# Patient Record
Sex: Female | Born: 1975 | Race: Black or African American | Hispanic: No | Marital: Married | Smoking: Never smoker
Health system: Southern US, Community
[De-identification: ages and names within clinical notes are randomized; demographics above are authoritative.]

## PROBLEM LIST (undated history)

## (undated) DIAGNOSIS — J302 Other seasonal allergic rhinitis: Secondary | ICD-10-CM

## (undated) DIAGNOSIS — D649 Anemia, unspecified: Secondary | ICD-10-CM

## (undated) DIAGNOSIS — N809 Endometriosis, unspecified: Secondary | ICD-10-CM

## (undated) DIAGNOSIS — Z9109 Other allergy status, other than to drugs and biological substances: Secondary | ICD-10-CM

## (undated) HISTORY — PX: WISDOM TOOTH EXTRACTION: SHX21

## (undated) HISTORY — PX: TUBAL LIGATION: SHX77

---

## 1998-03-04 ENCOUNTER — Other Ambulatory Visit: Admission: RE | Admit: 1998-03-04 | Discharge: 1998-03-04 | Payer: Self-pay | Admitting: *Deleted

## 1998-05-20 ENCOUNTER — Other Ambulatory Visit: Admission: RE | Admit: 1998-05-20 | Discharge: 1998-05-20 | Payer: Self-pay | Admitting: *Deleted

## 1999-09-03 ENCOUNTER — Other Ambulatory Visit: Admission: RE | Admit: 1999-09-03 | Discharge: 1999-09-03 | Payer: Self-pay | Admitting: *Deleted

## 2000-12-06 ENCOUNTER — Other Ambulatory Visit: Admission: RE | Admit: 2000-12-06 | Discharge: 2000-12-06 | Payer: Self-pay | Admitting: *Deleted

## 2001-02-25 ENCOUNTER — Emergency Department (HOSPITAL_COMMUNITY): Admission: EM | Admit: 2001-02-25 | Discharge: 2001-02-25 | Payer: Self-pay | Admitting: Emergency Medicine

## 2001-11-15 ENCOUNTER — Encounter (INDEPENDENT_AMBULATORY_CARE_PROVIDER_SITE_OTHER): Payer: Self-pay

## 2001-11-15 ENCOUNTER — Ambulatory Visit (HOSPITAL_COMMUNITY): Admission: RE | Admit: 2001-11-15 | Discharge: 2001-11-15 | Payer: Self-pay | Admitting: *Deleted

## 2002-04-17 ENCOUNTER — Other Ambulatory Visit: Admission: RE | Admit: 2002-04-17 | Discharge: 2002-04-17 | Payer: Self-pay | Admitting: *Deleted

## 2003-03-09 ENCOUNTER — Inpatient Hospital Stay (HOSPITAL_COMMUNITY): Admission: AD | Admit: 2003-03-09 | Discharge: 2003-03-12 | Payer: Self-pay | Admitting: *Deleted

## 2003-04-17 ENCOUNTER — Other Ambulatory Visit: Admission: RE | Admit: 2003-04-17 | Discharge: 2003-04-17 | Payer: Self-pay | Admitting: *Deleted

## 2003-09-19 ENCOUNTER — Emergency Department (HOSPITAL_COMMUNITY): Admission: EM | Admit: 2003-09-19 | Discharge: 2003-09-19 | Payer: Self-pay | Admitting: Emergency Medicine

## 2004-08-07 ENCOUNTER — Other Ambulatory Visit: Admission: RE | Admit: 2004-08-07 | Discharge: 2004-08-07 | Payer: Self-pay | Admitting: Obstetrics and Gynecology

## 2005-08-24 ENCOUNTER — Other Ambulatory Visit: Admission: RE | Admit: 2005-08-24 | Discharge: 2005-08-24 | Payer: Self-pay | Admitting: Obstetrics and Gynecology

## 2007-11-23 ENCOUNTER — Inpatient Hospital Stay (HOSPITAL_COMMUNITY): Admission: AD | Admit: 2007-11-23 | Discharge: 2007-11-26 | Payer: Self-pay | Admitting: Obstetrics and Gynecology

## 2007-11-23 ENCOUNTER — Encounter (INDEPENDENT_AMBULATORY_CARE_PROVIDER_SITE_OTHER): Payer: Self-pay | Admitting: Obstetrics and Gynecology

## 2009-08-12 ENCOUNTER — Emergency Department (HOSPITAL_COMMUNITY): Admission: EM | Admit: 2009-08-12 | Discharge: 2009-08-12 | Payer: Self-pay | Admitting: Family Medicine

## 2009-12-11 ENCOUNTER — Other Ambulatory Visit: Admission: RE | Admit: 2009-12-11 | Discharge: 2009-12-11 | Payer: Self-pay | Admitting: Internal Medicine

## 2010-02-03 ENCOUNTER — Emergency Department (HOSPITAL_COMMUNITY): Admission: EM | Admit: 2010-02-03 | Discharge: 2010-02-04 | Payer: Self-pay | Admitting: Emergency Medicine

## 2010-03-27 ENCOUNTER — Ambulatory Visit (HOSPITAL_COMMUNITY): Admission: RE | Admit: 2010-03-27 | Discharge: 2010-03-27 | Payer: Self-pay | Admitting: Obstetrics and Gynecology

## 2010-08-27 LAB — CBC
HCT: 36.5 % (ref 36.0–46.0)
Hemoglobin: 12 g/dL (ref 12.0–15.0)
MCHC: 32.8 g/dL (ref 30.0–36.0)
RBC: 4.08 MIL/uL (ref 3.87–5.11)
WBC: 4.2 10*3/uL (ref 4.0–10.5)

## 2010-08-27 LAB — SURGICAL PCR SCREEN: MRSA, PCR: NEGATIVE

## 2010-08-29 LAB — URINALYSIS, ROUTINE W REFLEX MICROSCOPIC
Glucose, UA: NEGATIVE mg/dL
Specific Gravity, Urine: 1.024 (ref 1.005–1.030)
pH: 6 (ref 5.0–8.0)

## 2010-08-29 LAB — BASIC METABOLIC PANEL
Chloride: 111 mEq/L (ref 96–112)
GFR calc Af Amer: >60 mL/min (ref >60)
GFR calc non Af Amer: >60 mL/min (ref >60)
Potassium: 3.3 mEq/L — ABNORMAL LOW (ref 3.5–5.1)
Sodium: 140 mEq/L (ref 135–145)

## 2010-09-04 LAB — POCT RAPID STREP A (OFFICE): Streptococcus, Group A Screen (Direct): NEGATIVE

## 2010-10-28 NOTE — Discharge Summary (Signed)
Rachel Reid, Rachel Reid                ACCOUNT NO.:  [id number]   MEDICAL RECORD NO.:  [id number]          PATIENT TYPE:  INP   LOCATION:  9102                          FACILITY:  [facility]   PHYSICIAN:  Guy Sandifer. Henderson Cloud, M.D. DATE OF BIRTH:  06/11/1976   DATE OF ADMISSION:  11/23/2007  DATE OF DISCHARGE:  11/26/2007                               DISCHARGE SUMMARY   ADMITTING DIAGNOSES:  1. Intrauterine pregnancy at term.  2. Labor.   DISCHARGE DIAGNOSES:  1. Intrauterine pregnancy at term.  2. Labor.   PROCEDURE:  On November 23, 2007 is low-transverse cesarean section.   REASON FOR ADMISSION:  This patient is a 36 year old married black  female G4, P1 who presents in labor.  She is admitted to the hospital  for delivery.   HOSPITAL COURSE:  She was admitted to the hospital and proceeds to 6-7  cm dilation.  She develops fever of 102 degrees.  Diagnosis of  cephalopelvic disproportion and amnionitis were made and the patient was  taken to cesarean section.  This productive of a viable female baby,  Apgars of 9 and 9.  Birth weight 8 pounds 7 ounces.  On the first  postoperative day, vital signs were stable.  She was treated with  intravenous Unasyn.  This was then converted to oral Augmentin.  Postoperatively, the hemoglobin is 9.2.  Pathology is pending.   CONDITION ON DISCHARGE:  Good.   DIET:  Regular as tolerated.   ACTIVITY:  No lifting.  No operation of automobiles.  No vaginal entry.  She is to call the office for problems include, but not limited to  temperature of 101 degrees, persistent nausea, vomiting, or heavy  bleeding.   MEDICATIONS:  1. Percocet 5/325 mg #40, 1-2 p.o. q.6 h. p.r.n.  2. Ibuprofen 600 mg q.6 h. p.r.n.  3. Augmentin 500 mg, #9, q.8 h.  4. Iron supplement daily.  5. Prenatal vitamin daily.   FOLLOWUP:  Follow up with [place] in the office in 2 weeks.      Guy Sandifer Henderson Cloud, M.D.  Electronically Signed     JET/MEDQ  D:  11/26/2007  T:  11/26/2007   Job:  [id number]

## 2010-10-28 NOTE — Op Note (Signed)
Rachel Reid, Rachel Reid                ACCOUNT NO.:  [id number]   MEDICAL RECORD NO.:  [id number]          PATIENT TYPE:  INP   LOCATION:  9102                          FACILITY:  [facility]   PHYSICIAN:  Duke Salvia. Marcelle Overlie, M.D.DATE OF BIRTH:  1976/03/24   DATE OF PROCEDURE:  DATE OF DISCHARGE:                               OPERATIVE REPORT   PREOPERATIVE DIAGNOSES:  Chronic obstructive pulmonary disease,  chorioamnionitis.   POSTOPERATIVE DIAGNOSIS:  Chronic obstructive pulmonary disease,  chorioamnionitis, plus OP presentation.   SURGEON:  Duke Salvia. Marcelle Overlie, M.D.   ANESTHESIA:  Epidural.   ESTIMATED BLOOD LOSS:  800 mL.   PROCEDURE AND FINDINGS:  The patient was taken to the operating room.  After an adequate level of epidural anesthesia was obtained with the  patient's legs supine, the abdomen was prepped and draped in usual  manner under sterile warm procedures.  Foley catheter was already  positioned draining clear urine.  She had already been started on Unasyn  IV due to a temperature elevation of 102 in labor.   Transverse Pfannenstiel incision was made after prepping and draping,  carried down to the fascia which was incised and extended transversely.  Rectus muscle divided in the midline.  Peritoneum entered superiorly  without incident and extended in a vertical fashion.  The vas  vesicouterine serosa was then incised and the bladder was bluntly and  sharply dissected off the lower uterine segment, bladder blade  repositioned.  Transverse incision made the lower segment extended with  blunt dissection.  The vertex was noted to be straight OP, was gently  rotated, easy delivery with fundal pressure of 8 pounds 7 ounces female,  Apgars 9 and 9.  The infant was suctioned, cord clamped, and passed to  pediatric team for further care.  Cord pH was sent.  Placenta was  extracted manually.  Sent to pathology.  Uterus exteriorized.  Cavity  wiped and cleaned with laparotomy pack.   Closure obtained with first  layer of 0 chromic in a locked fashion followed by an imbricating layer  of 0 chromic.  This was hemostatic.  Tubes and ovaries were normal.  The  bladder flap area was intact and hemostatic.  Prior to closure of  sponge, needle, and instrument counts reported as correct x2.  Peritoneum closed with a running 2-0 Vicryl suture.  Fascia closed from  laterally to midline on either side with a zero PDS suture.  Subcutaneous tissue was hemostatic.  Clips and Steri-Strips used on the  skin.  She tolerated this well and went to recovery room in good  condition.      Richard M. Marcelle Overlie, M.D.  Electronically Signed     RMH/MEDQ  D:  11/23/2007  T:  11/24/2007  Job:  [id number]

## 2010-10-31 NOTE — Op Note (Signed)
[facility]  Patient:    Rachel Reid, Rachel Reid Visit Number: [id number] MRN: [id number]          Service Type: DSU Location: [facility] Attending Physician:  Donne Hazel Dictated by:   Willey Blade, M.D. Proc. Date: 11/15/01 Admit Date:  11/15/2001                             Operative Report  PREOPERATIVE DIAGNOSES:       Incomplete abortion.  POSTOPERATIVE DIAGNOSES:      Incomplete abortion.  PROCEDURE:                    Dilatation and evacuation.  SURGEON:                      Willey Blade, M.D.  ANESTHESIA:                   MAC with local supplementation.  ESTIMATED BLOOD LOSS:         50 cc.  COMPLICATIONS:                None.  FINDINGS:                     Products of conception.  PROCEDURE:                    The patient was taken to the operating room where a MAC anesthetic was administered.  The patient was placed on the operating table in a dorsal lithotomy position.  The perineum and vagina were prepped and draped in the usual sterile fashion with Betadine and sterile drapes.  A speculum was placed and the anterior lip of the cervix was grasped with a single tooth tenaculum.  The anterior lip of the cervix was grasped with a single tooth tenaculum and a paracervical block was placed with about 12 cc of 1% Xylocaine in the 3 and 9 oclock position.  The cervix was then serially dilated until a #21 Pratt dilator fit easily into the intracervical os.  Multiple passes were then made with the suction curette using a #7 curved suction curette.  Multiple passes were made and products of conception were aspirated.  This was done by about five times and the uterus was noted to be empty.  All vaginal instruments were then removed and the patient awakened, and taken to the recovery room in good condition.  Blood type O+.  There were no perioperative complications. Dictated by:   Willey Blade, M.D. Attending Physician:  Donne Hazel DD:  11/15/01 TD:  11/16/01 Job: [id number]

## 2011-03-12 LAB — CBC
HCT: 27.2 — ABNORMAL LOW
HCT: 36.6
HCT: 40
Hemoglobin: 13.3
Hemoglobin: 9.2 — ABNORMAL LOW
MCHC: 33.8
MCV: 91.4
MCV: 92.3
RBC: 2.95 — ABNORMAL LOW
RBC: 4.01
RDW: 14.1
WBC: 10.1
WBC: 9.4

## 2011-03-12 LAB — COMPREHENSIVE METABOLIC PANEL
AST: 30
Alkaline Phosphatase: 179 — ABNORMAL HIGH
CO2: 22
Chloride: 104
Creatinine, Ser: 0.68
GFR calc Af Amer: >60
GFR calc non Af Amer: >60
Potassium: 4
Total Bilirubin: 0.8

## 2011-03-12 LAB — PROTEIN, URINE, RANDOM: Total Protein, Urine: 46

## 2011-03-12 LAB — SAMPLE TO BLOOD BANK

## 2012-01-21 ENCOUNTER — Emergency Department (HOSPITAL_COMMUNITY): Payer: 59

## 2012-01-21 ENCOUNTER — Emergency Department (HOSPITAL_COMMUNITY)
Admission: EM | Admit: 2012-01-21 | Discharge: 2012-01-21 | Disposition: A | Discharge status: Home / Self Care | Payer: 59 | Attending: Emergency Medicine | Admitting: Emergency Medicine

## 2012-01-21 ENCOUNTER — Encounter (HOSPITAL_COMMUNITY): Payer: Self-pay | Admitting: Emergency Medicine

## 2012-01-21 DIAGNOSIS — R51 Headache: Secondary | ICD-10-CM

## 2012-01-21 DIAGNOSIS — R42 Dizziness and giddiness: Secondary | ICD-10-CM | POA: Insufficient documentation

## 2012-01-21 DIAGNOSIS — R110 Nausea: Secondary | ICD-10-CM

## 2012-01-21 LAB — CBC
MCH: 28.4 pg (ref 26.0–34.0)
MCHC: 33.2 g/dL (ref 30.0–36.0)
MCV: 85.6 fL (ref 78.0–100.0)
Platelets: 223 10*3/uL (ref 150–400)
RBC: 4.3 MIL/uL (ref 3.87–5.11)
RDW: 13 % (ref 11.5–15.5)

## 2012-01-21 LAB — URINALYSIS, ROUTINE W REFLEX MICROSCOPIC
Glucose, UA: NEGATIVE mg/dL
Ketones, ur: NEGATIVE mg/dL
Leukocytes, UA: NEGATIVE
Protein, ur: NEGATIVE mg/dL
Urobilinogen, UA: 0.2 mg/dL (ref 0.0–1.0)

## 2012-01-21 LAB — BASIC METABOLIC PANEL
Calcium: 8.7 mg/dL (ref 8.4–10.5)
Creatinine, Ser: 0.63 mg/dL (ref 0.50–1.10)
GFR calc Af Amer: >90 mL/min (ref >90)
GFR calc non Af Amer: >90 mL/min (ref >90)
Sodium: 137 mEq/L (ref 135–145)

## 2012-01-21 LAB — URINE MICROSCOPIC-ADD ON

## 2012-01-21 MED ORDER — METOCLOPRAMIDE HCL 5 MG/ML IJ SOLN
10.0000 mg | Freq: Once | INTRAMUSCULAR | Status: AC
  Administered 2012-01-21: 10 mg via INTRAVENOUS
  Filled 2012-01-21: qty 2

## 2012-01-21 MED ORDER — HYDROCODONE-ACETAMINOPHEN 5-325 MG PO TABS: 1.0000 | ORAL_TABLET | ORAL | Status: AC | PRN

## 2012-01-21 MED ORDER — ONDANSETRON HCL 4 MG/2ML IJ SOLN
4.0000 mg | Freq: Once | INTRAMUSCULAR | Status: AC
  Administered 2012-01-21: 4 mg via INTRAVENOUS
  Filled 2012-01-21: qty 2

## 2012-01-21 MED ORDER — DIPHENHYDRAMINE HCL 50 MG/ML IJ SOLN
25.0000 mg | Freq: Once | INTRAMUSCULAR | Status: AC
  Administered 2012-01-21: 25 mg via INTRAVENOUS
  Filled 2012-01-21: qty 1

## 2012-01-21 MED ORDER — ONDANSETRON 8 MG PO TBDP: 8.0000 mg | ORAL_TABLET | Freq: Three times a day (TID) | ORAL | Status: AC | PRN

## 2012-01-21 MED ORDER — SODIUM CHLORIDE 0.9 % IV SOLN
Freq: Once | INTRAVENOUS | Status: AC
  Administered 2012-01-21: 06:00:00 via INTRAVENOUS

## 2012-01-21 MED ORDER — DEXAMETHASONE SODIUM PHOSPHATE 10 MG/ML IJ SOLN
10.0000 mg | Freq: Once | INTRAMUSCULAR | Status: AC
  Administered 2012-01-21: 10 mg via INTRAVENOUS
  Filled 2012-01-21: qty 1

## 2012-01-21 MED ORDER — POTASSIUM CHLORIDE CRYS ER 20 MEQ PO TBCR
40.0000 meq | EXTENDED_RELEASE_TABLET | Freq: Once | ORAL | Status: AC
  Administered 2012-01-21: 40 meq via ORAL
  Filled 2012-01-21: qty 2

## 2012-01-21 MED ORDER — MORPHINE SULFATE 4 MG/ML IJ SOLN
4.0000 mg | Freq: Once | INTRAMUSCULAR | Status: AC
  Administered 2012-01-21: 4 mg via INTRAVENOUS
  Filled 2012-01-21: qty 1

## 2012-01-21 NOTE — ED Notes (Signed)
Pt alert, arrives from home, c/o headache, nausea, dizziness, onset was this am, denies hx of, resp even unlabored, skin pwd, ambulates to triage, steady gait noted, speech clear

## 2012-01-21 NOTE — ED Provider Notes (Signed)
History     CSN: [id number]  Arrival date & time 01/21/12  0534   First MD Initiated Contact with Patient 01/21/12 [phone]      Chief Complaint  Patient presents with  . Headache    (Consider location/radiation/quality/duration/timing/severity/associated sxs/prior treatment) Patient is a 36 y.o. female presenting with headaches. The history is provided by the patient, the spouse and medical records.  Headache  Associated symptoms include nausea. Pertinent negatives include no fever, no shortness of breath and no vomiting.   Rachel Reid is a 36 y.o. female presents to the emergency department complaining of headache that  woke her from sleep approximately 1 AM.  She's associated nausea, fatigue and lightheadedness.  She states she feels like she might pass out, but has not had a syncopal episode.  She describes her headache as generalized, feels like a pressure and 5/10.  It began on the left side of her head, behind the left eye and is now located more generally and at the occiput. She denies that the room is spinning but feels off balance. She took one 200 mg Advil at the onset of the headache without any relief. She denies a history of migraine. She states this headache feels different from her normal tension headaches. She denies photophobia and vomiting.  She also denies sudden onset, changes in vision or this being the worst headache of her life.  She denies that the room is spinning and the lightheadedness does not worsen with lying or closing his eyes.  She denies fever, chills, confusion, vomiting, diarrhea, abdominal pain, weakness, numbness or tingling.  She complains of URI symptoms approximately one week ago which has completely resolved.    History reviewed. No pertinent past medical history.  History reviewed. No pertinent past surgical history.  No family history on file.  History  Substance Use Topics  . Smoking status: Never Smoker   . Smokeless tobacco: Not on file  .  Alcohol Use: No    OB History    Grav Para Term Preterm Abortions TAB SAB Ect Mult Living                  Review of Systems  Constitutional: Positive for fatigue. Negative for fever, diaphoresis, appetite change and unexpected weight change.  HENT: Negative for mouth sores, neck pain and neck stiffness.   Eyes: Negative for visual disturbance.  Respiratory: Negative for cough, chest tightness, shortness of breath and wheezing.   Cardiovascular: Negative for chest pain.  Gastrointestinal: Positive for nausea. Negative for vomiting, abdominal pain, diarrhea and constipation.  Genitourinary: Negative for dysuria, urgency, frequency and hematuria.  Musculoskeletal: Negative for back pain and gait problem.  Skin: Negative for rash.  Neurological: Positive for light-headedness and headaches. Negative for syncope.    Allergies  Sulfa drugs cross reactors  Home Medications  No current outpatient prescriptions on file.  BP 127/86  Pulse 76  Temp 98.6 F (37 C) (Oral)  Resp 20  Wt 125 lb (56.7 kg)  SpO2 100%  LMP 01/21/2012  Physical Exam  Nursing note and vitals reviewed. Constitutional: She is oriented to person, place, and time. She appears well-developed and well-nourished. No distress.  HENT:  Head: Normocephalic and atraumatic.  Right Ear: Tympanic membrane, external ear and ear canal normal.  Left Ear: Tympanic membrane, external ear and ear canal normal.  Nose: Nose normal. Right sinus exhibits no maxillary sinus tenderness and no frontal sinus tenderness. Left sinus exhibits no maxillary sinus tenderness and no  frontal sinus tenderness.  Mouth/Throat: Uvula is midline, oropharynx is clear and moist and mucous membranes are normal. She does not have dentures. Normal dentition. No oropharyngeal exudate.  Eyes: Conjunctivae are normal. No scleral icterus.  Neck: Normal range of motion. Neck supple.  Cardiovascular: Normal rate, regular rhythm and intact distal pulses.   Exam reveals no gallop and no friction rub.   No murmur heard. Pulmonary/Chest: Effort normal and breath sounds normal. No respiratory distress. She has no wheezes.  Abdominal: Soft. Bowel sounds are normal. She exhibits no mass. There is no tenderness. There is no rebound and no guarding.  Musculoskeletal: Normal range of motion. She exhibits no edema.  Neurological: She is alert and oriented to person, place, and time. She exhibits normal muscle tone. Coordination normal.       Speech is clear and goal oriented, follows commands Cranial nerves III - XII without deficit, no facial droop Normal strength in upper and lower extremities bilaterally, strong and equal grip strength Sensation normal to light and sharp touch Moves extremities without ataxia, coordination intact Normal finger to nose and rapid alternating movements Neg romberg, no pronator drift Normal gait    Skin: Skin is warm and dry. She is not diaphoretic.  Psychiatric: She has a normal mood and affect.    ED Course  Procedures (including critical care time)  Labs Reviewed  CBC - Abnormal; Notable for the following:    WBC 3.8 (*)     All other components within normal limits  BASIC METABOLIC PANEL - Abnormal; Notable for the following:    Potassium 3.1 (*)     All other components within normal limits  URINALYSIS, ROUTINE W REFLEX MICROSCOPIC - Abnormal; Notable for the following:    Hgb urine dipstick TRACE (*)     All other components within normal limits  URINE MICROSCOPIC-ADD ON - Abnormal; Notable for the following:    Squamous Epithelial / LPF FEW (*)     All other components within normal limits   No results found.   1. Headache   2. Nausea       MDM  [name] presents with a headache for 6 hours.  She is a healthy, nontoxic appearing female.  She is at low risk for a SAH, but I will obtain a head CT since her ssx are not classically migraine or vertigo.  I have given Zofran and Morphine for  symptom control.  Head CT is without intracranial abnormality, CBC and UA are unremarkable.  BMP with a low potassium, I will replace orally.  I will also give decadron, Reglan and benadryl and reassess.  She feels some better after the treatment. I believe this is most likely sinus/allergy related 2/2 a recent URI, hx of seasonal allergies and complaints of ear pressure and fullness without an acute otitis media.  This may be migrainous in nature, but we have treated for that here in the ED.  I will discharge home and have her follow up with her PCP.    1. Medications: vicoden, zofran 2. Treatment: rest, take medication as prescribed 3. Follow Up: with PCP         Dierdre Forth, PA-C 01/21/12 [phone]

## 2012-01-21 NOTE — ED Provider Notes (Signed)
None specific HA. Normal neuro exam. Ct head normal. Improvement in symptoms. Dc home with pcp follow up  Lyanne Co, MD 01/21/12 [phone]

## 2012-01-21 NOTE — ED Notes (Addendum)
Pt reports that she took Advil for her headache at around 4am today but it did not ease the pain at all. Pt presents no s/s colds or upper respiratory infection.  Denies trauma to head. Pt denies hx of migraine headache, states that this is new onset.

## 2012-01-22 NOTE — ED Provider Notes (Signed)
Medical screening examination/treatment/procedure(s) were performed by non-physician practitioner and as supervising physician I was immediately available for consultation/collaboration.  Geoffery Lyons, MD 01/22/12 [phone]

## 2013-09-25 IMAGING — CT CT HEAD W/O CM
2 series · 16 of 30 positions shown, 20 images · non-contrast
Comparison: None

CLINICAL DATA: Headache.

CT HEAD WITHOUT CONTRAST
TECHNIQUE: Contiguous axial images were obtained from the base of
the skull through the vertex without contrast.

[Series 2: head w/o · axial · non-contrast · 0.43mm/px · z∈[+1411,+1531]mm · 13 of 28 slices shown, 17 images]
[im 2/28  brain]
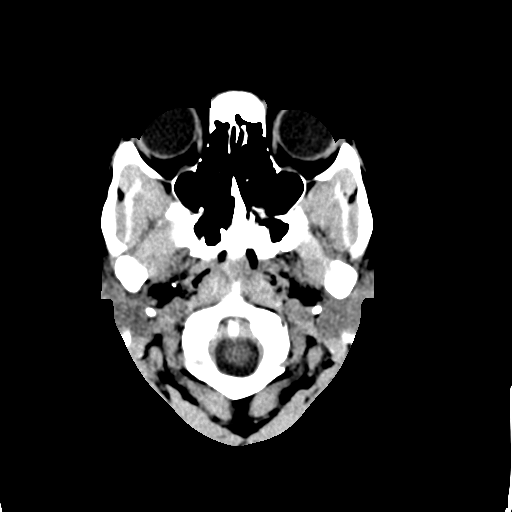
[im 2/28  bone]
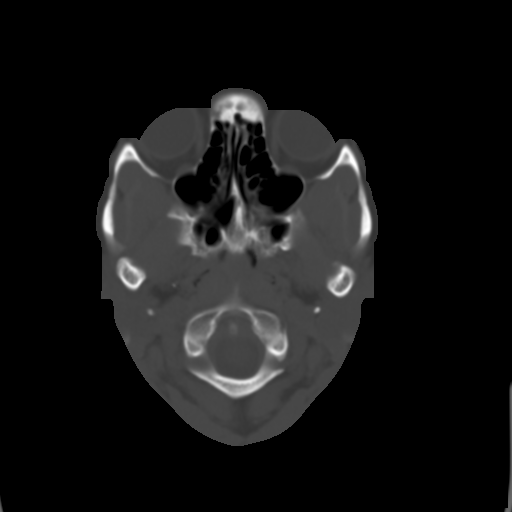
[im 4/28  brain]
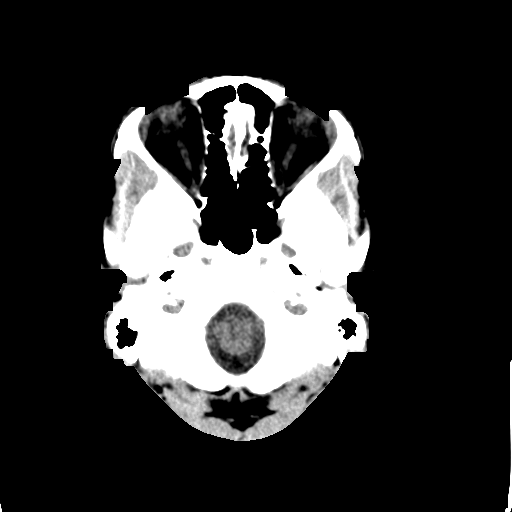
[im 6/28  brain]
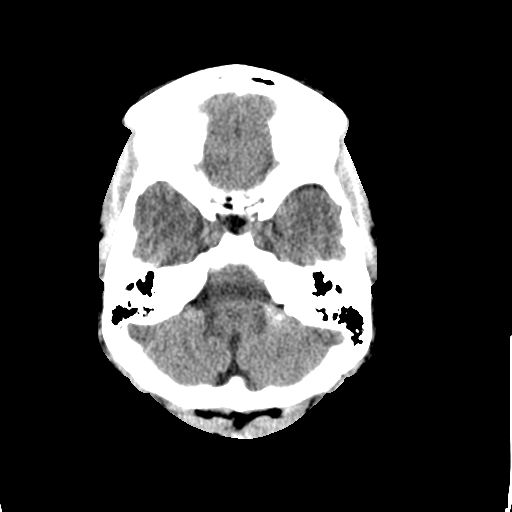
[im 8/28  brain]
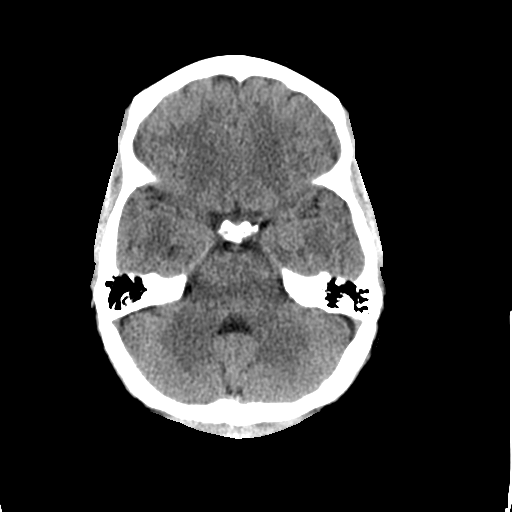
[im 10/28  brain]
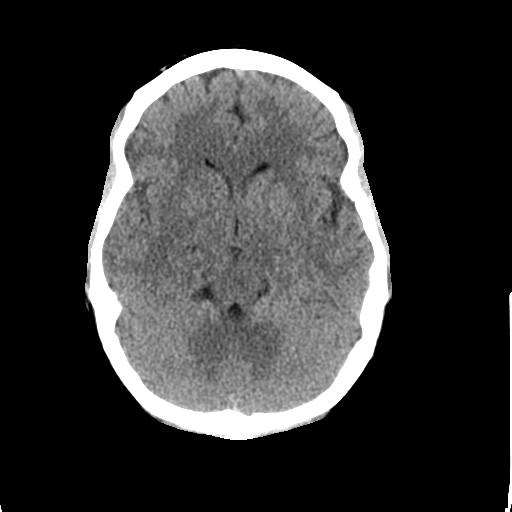
[im 10/28  bone]
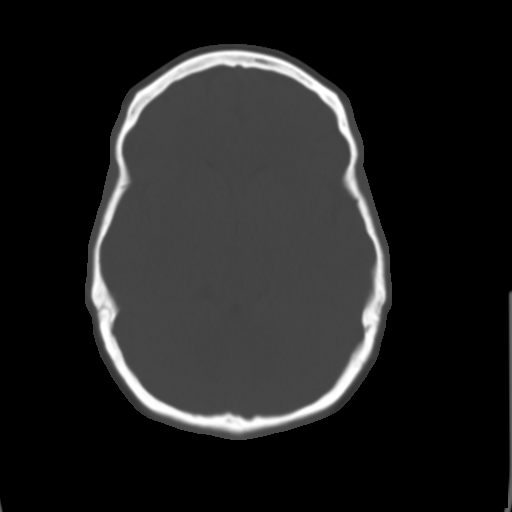
[im 12/28  brain]
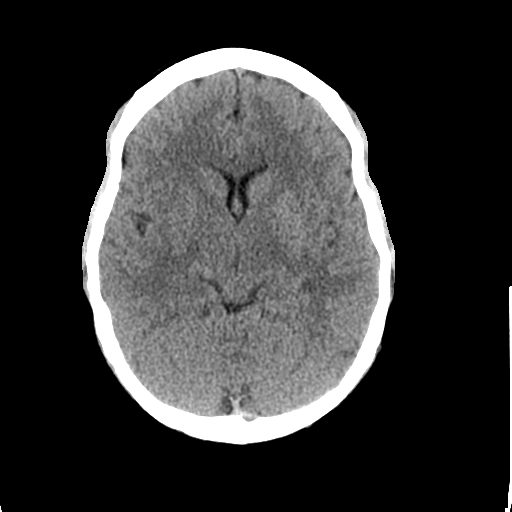
[im 14/28  brain]
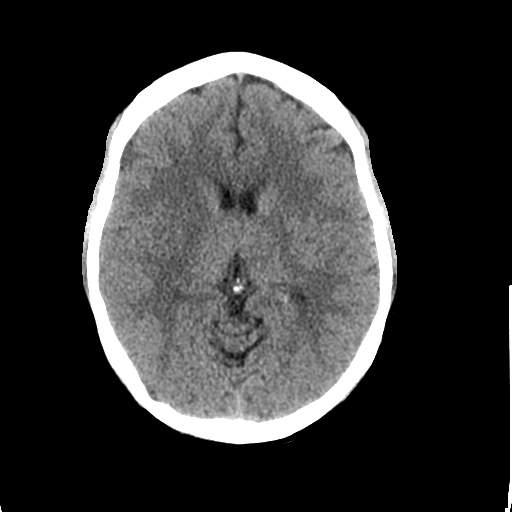
[im 16/28  brain]
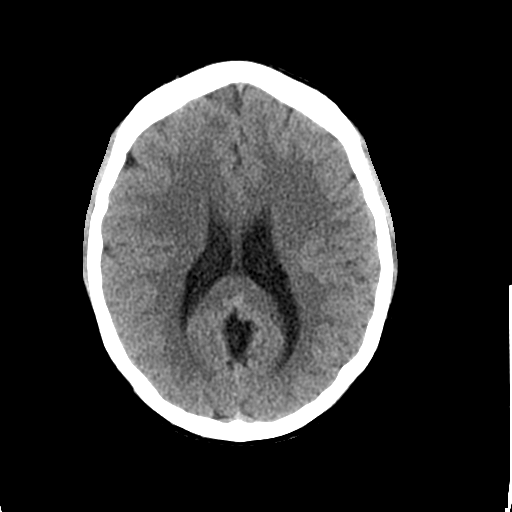
[im 18/28  brain]
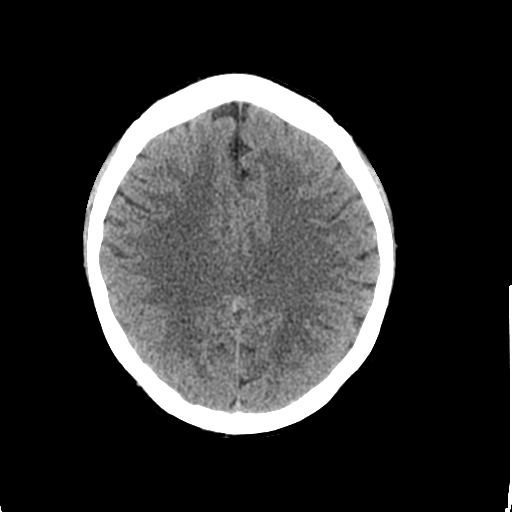
[im 18/28  bone]
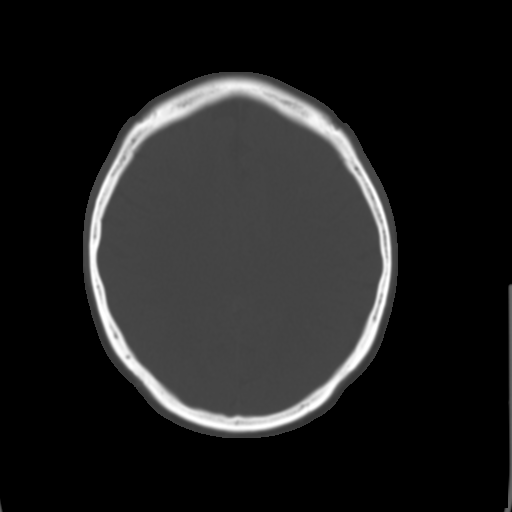
[im 20/28  brain]
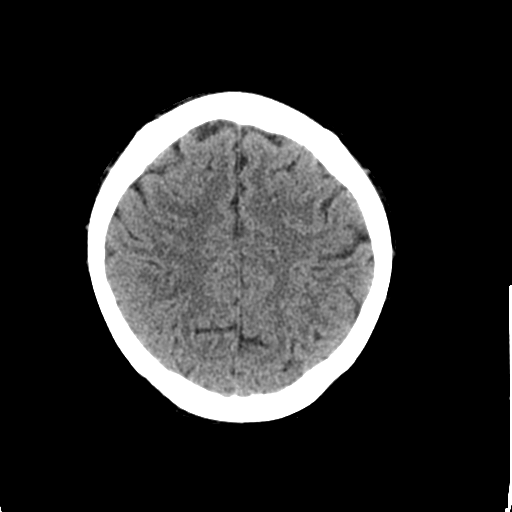
[im 22/28  brain]
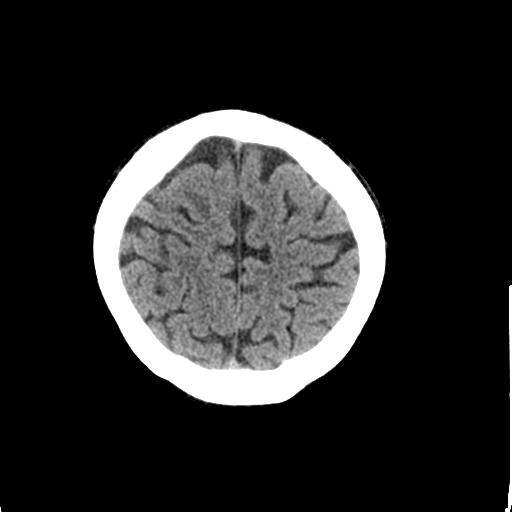
[im 24/28  brain]
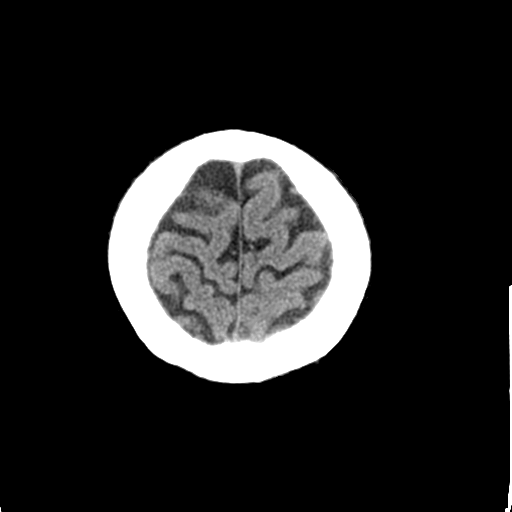
[im 26/28  brain]
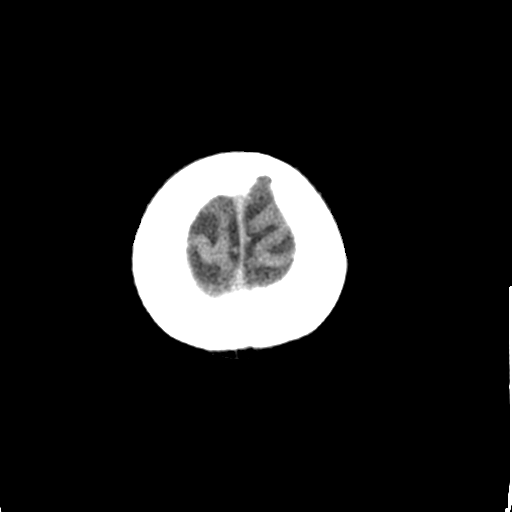
[im 26/28  bone]
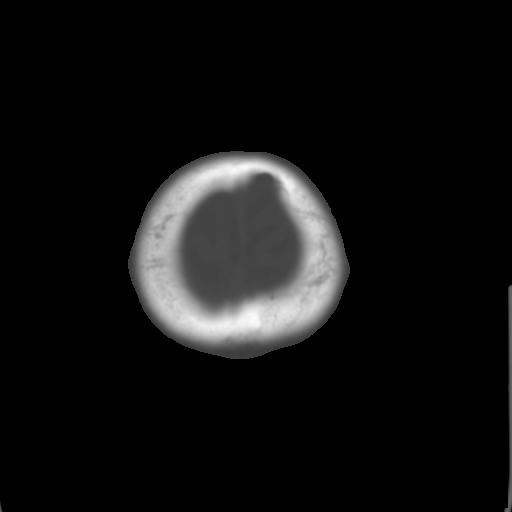

[Series 3: bone windows · axial · 0.43mm/px · z∈[+1411,+1451]mm · 3 of 28 slices shown]
[im 2/28  bone]
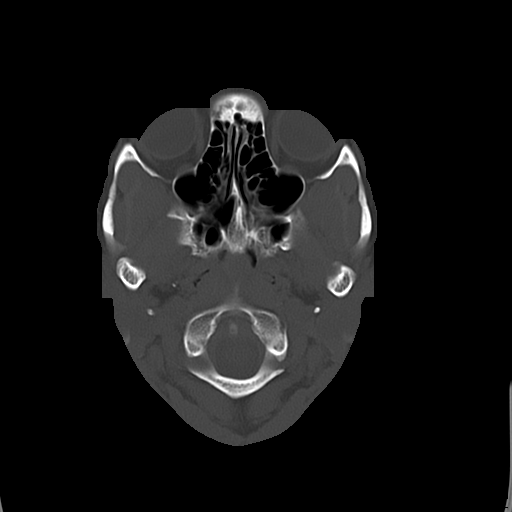
[im 6/28  bone]
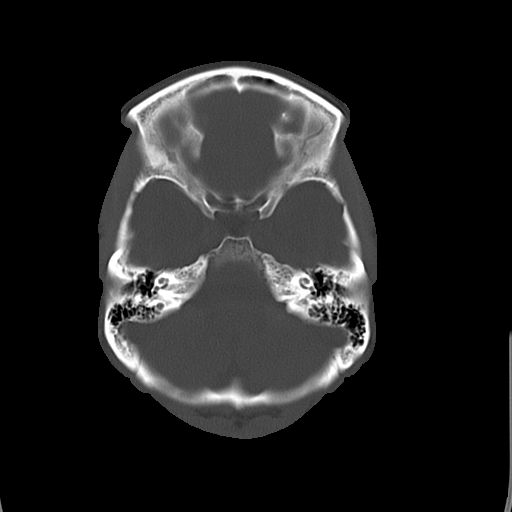
[im 10/28  bone]
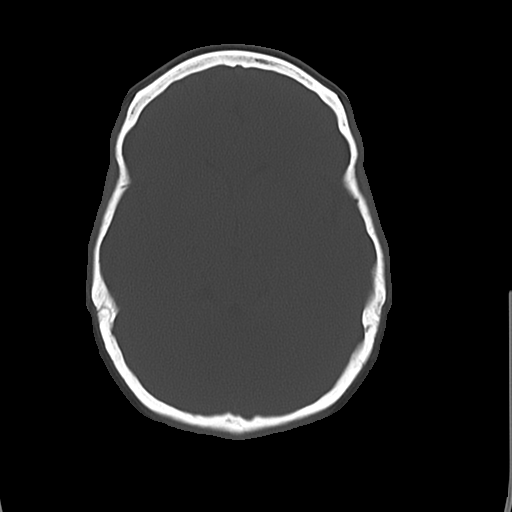

[16 of 30 positions shown; findings below may reference images not displayed]

FINDINGS: No acute intracranial abnormality.  Specifically, no
hemorrhage, hydrocephalus, mass lesion, acute infarction, or
significant intracranial injury.  No acute calvarial abnormality.
Visualized paranasal sinuses and mastoids clear.  Orbital soft
tissues unremarkable.
IMPRESSION: Normal study.

## 2014-01-02 ENCOUNTER — Encounter (HOSPITAL_COMMUNITY): Payer: Self-pay | Admitting: Pharmacy Technician

## 2014-01-04 NOTE — H&P (Signed)
  Patient name  Rachel Reid, Rachel Reid DICTATION#  [id number] CSN# [id number]  Darlyn Chamber, MD 01/04/2014 9:53 AM

## 2014-01-05 NOTE — H&P (Signed)
NAMECHANNELLE, BOTTGER NO.:  [id number]  MEDICAL RECORD NO.:  [id number]  LOCATION:                                 FACILITY:  PHYSICIAN:  Darlyn Chamber, M.D.   DATE OF BIRTH:  06/23/1975  DATE OF ADMISSION:  01/15/2014 DATE OF DISCHARGE:                             HISTORY & PHYSICAL   DATE OF SURGERY:  January 15, 2014.  HISTORY OF PRESENT ILLNESS:  The patient is a 38 year old gravida 2, para 2 female, who presents for laparoscopically assisted vaginal hysterectomy, poor management of pelvic pain secondary to pelvic endometriosis.  At the time of the patient's tubal, it was noted that she did have pelvic endometriosis.  Attempted treatment was undertaken.  She continues to have problems with dyspareunia and pain during deep penetration has become extremely limiting.  She underwent complete urological evaluation here in town, which came back negative.  In view of the continued pain and lack of response to conservative management including hormonal agents, we decided to proceed with laparoscopic- assisted vaginal hysterectomy.  Other options including repeat laparoscopy and hormonal suppression have been discussed.  ALLERGIES:  She is allergic to sulfa.  MEDICATIONS:  She is on no medications.  PAST SURGICAL HISTORY:  She had a laparoscopic tubal and two vaginal deliveries.  SOCIAL HISTORY:  Reveals no tobacco or alcohol use.  FAMILY HISTORY:  Noncontributory.  REVIEW OF SYSTEMS:  Noncontributory.  PHYSICAL EXAMINATION:  VITAL SIGNS:  The patient is afebrile.  Stable vital signs. HEENT:  The patient is normocephalic.  Pupils equal, round, reactive to light and accommodation.  Extraocular movements are intact.  Sclerae and conjunctivae are clear.  Oropharynx clear. NECK:  Without thyromegaly. BREASTS:  No discrete masses. LUNGS:  Clear. CARDIOVASCULAR:  Regular rhythm and rate without murmurs or gallops. ABDOMEN:  Benign.  No mass, organomegaly,  or tenderness. PELVIC:  Normal external genitalia.  Vaginal mucosa is clear.  Cervix unremarkable.  There is cul-de-sac tenderness.  Adnexa unremarkable. EXTREMITIES:  Trace edema. NEUROLOGIC:  Grossly within normal limits.  IMPRESSION:  Continued pelvic pain and dyspareunia secondary to endometriosis.  PLAN:  After discussion of options, the patient will undergo laparoscopic-assisted vaginal hysterectomy.  Ovaries will be left in place.  The risk of continued pain despite hysterectomy has been discussed.  The nature of the procedure has been explained.  Risks discussed including risk of infection, risk of hemorrhage that could require transfusion with the risk of AIDS or hepatitis, risk of injury to adjacent organs including bladder, bowel, ureters that could require further exploratory surgery, risk of deep venous thrombosis and pulmonary embolus.  The patient expressed understanding of potential risks, complications, and other alternatives.     Darlyn Chamber, M.D.     JSM/MEDQ  D:  01/04/2014  T:  01/04/2014  Job:  [id number]

## 2014-01-08 ENCOUNTER — Encounter (HOSPITAL_COMMUNITY): Payer: Self-pay

## 2014-01-08 ENCOUNTER — Encounter (HOSPITAL_COMMUNITY)
Admission: RE | Admit: 2014-01-08 | Discharge: 2014-01-08 | Disposition: A | Discharge status: Home / Self Care | Payer: Managed Care, Other (non HMO) | Source: Ambulatory Visit | Attending: Obstetrics and Gynecology | Admitting: Obstetrics and Gynecology

## 2014-01-08 DIAGNOSIS — Z01818 Encounter for other preprocedural examination: Secondary | ICD-10-CM | POA: Diagnosis not present

## 2014-01-08 DIAGNOSIS — Z01812 Encounter for preprocedural laboratory examination: Secondary | ICD-10-CM | POA: Insufficient documentation

## 2014-01-08 HISTORY — DX: Endometriosis, unspecified: N80.9

## 2014-01-08 HISTORY — DX: Anemia, unspecified: D64.9

## 2014-01-08 HISTORY — DX: Other allergy status, other than to drugs and biological substances: Z91.09

## 2014-01-08 HISTORY — DX: Other seasonal allergic rhinitis: J30.2

## 2014-01-08 LAB — CBC
HEMATOCRIT: 35.6 % — AB (ref 36.0–46.0)
HEMOGLOBIN: 11.6 g/dL — AB (ref 12.0–15.0)
MCH: 29.1 pg (ref 26.0–34.0)
MCHC: 32.6 g/dL (ref 30.0–36.0)
MCV: 89.2 fL (ref 78.0–100.0)
Platelets: 227 10*3/uL (ref 150–400)
RBC: 3.99 MIL/uL (ref 3.87–5.11)
RDW: 13.4 % (ref 11.5–15.5)
WBC: 4.6 10*3/uL (ref 4.0–10.5)

## 2014-01-08 NOTE — Patient Instructions (Addendum)
   Your procedure is scheduled on:  Monday, August 3  Enter through the [employer] [facility] at: 6 AM Pick up the phone at the desk and dial [phone] and inform us of your arrival.  Please call this number if you have any problems the morning of surgery: [phone]  Remember: Do not eat or drink after midnight: Sunday Take these medicines the morning of surgery with a SIP OF WATER:  allegra  Do not wear jewelry, make-up, or FINGER nail polish No metal in your hair or on your body. Do not wear lotions, powders, perfumes.  You may wear deodorant.  Do not bring valuables to the hospital. Contacts, dentures or bridgework may not be worn into surgery.  Leave suitcase in the car. After Surgery it may be brought to your room. For patients being admitted to the hospital, checkout time is 11:00am the day of discharge. Home with husband Rachel Reid cell [phone]

## 2014-01-15 ENCOUNTER — Encounter (HOSPITAL_COMMUNITY)
Admission: RE | Disposition: A | Discharge status: Home / Self Care | Payer: Self-pay | Source: Ambulatory Visit | Attending: Obstetrics and Gynecology

## 2014-01-15 ENCOUNTER — Ambulatory Visit (HOSPITAL_COMMUNITY): Payer: Managed Care, Other (non HMO) | Admitting: Anesthesiology

## 2014-01-15 ENCOUNTER — Encounter (HOSPITAL_COMMUNITY): Payer: Managed Care, Other (non HMO) | Admitting: Anesthesiology

## 2014-01-15 ENCOUNTER — Observation Stay (HOSPITAL_COMMUNITY)
Admission: RE | Admit: 2014-01-15 | Discharge: 2014-01-16 | Disposition: A | Discharge status: Home / Self Care | Payer: Managed Care, Other (non HMO) | Source: Ambulatory Visit | Attending: Obstetrics and Gynecology | Admitting: Obstetrics and Gynecology

## 2014-01-15 ENCOUNTER — Encounter (HOSPITAL_COMMUNITY): Payer: Self-pay | Admitting: Anesthesiology

## 2014-01-15 DIAGNOSIS — K219 Gastro-esophageal reflux disease without esophagitis: Secondary | ICD-10-CM | POA: Insufficient documentation

## 2014-01-15 DIAGNOSIS — N809 Endometriosis, unspecified: Secondary | ICD-10-CM | POA: Diagnosis present

## 2014-01-15 DIAGNOSIS — D649 Anemia, unspecified: Secondary | ICD-10-CM | POA: Insufficient documentation

## 2014-01-15 DIAGNOSIS — Z882 Allergy status to sulfonamides status: Secondary | ICD-10-CM | POA: Insufficient documentation

## 2014-01-15 DIAGNOSIS — N949 Unspecified condition associated with female genital organs and menstrual cycle: Principal | ICD-10-CM | POA: Insufficient documentation

## 2014-01-15 DIAGNOSIS — IMO0002 Reserved for concepts with insufficient information to code with codable children: Secondary | ICD-10-CM | POA: Insufficient documentation

## 2014-01-15 DIAGNOSIS — N803 Endometriosis of pelvic peritoneum, unspecified: Secondary | ICD-10-CM | POA: Insufficient documentation

## 2014-01-15 DIAGNOSIS — D251 Intramural leiomyoma of uterus: Secondary | ICD-10-CM | POA: Insufficient documentation

## 2014-01-15 DIAGNOSIS — N838 Other noninflammatory disorders of ovary, fallopian tube and broad ligament: Secondary | ICD-10-CM | POA: Insufficient documentation

## 2014-01-15 HISTORY — PX: BILATERAL SALPINGECTOMY: SHX5743

## 2014-01-15 HISTORY — PX: LAPAROSCOPIC ASSISTED VAGINAL HYSTERECTOMY: SHX5398

## 2014-01-15 LAB — HCG, SERUM, QUALITATIVE: PREG SERUM: NEGATIVE

## 2014-01-15 SURGERY — HYSTERECTOMY, VAGINAL, LAPAROSCOPY-ASSISTED
Anesthesia: General | Site: Abdomen

## 2014-01-15 MED ORDER — FLUMAZENIL 0.5 MG/5ML IV SOLN
INTRAVENOUS | Status: AC
  Filled 2014-01-15: qty 5

## 2014-01-15 MED ORDER — KETOROLAC TROMETHAMINE 30 MG/ML IJ SOLN
INTRAMUSCULAR | Status: AC
  Filled 2014-01-15: qty 1

## 2014-01-15 MED ORDER — HYDROMORPHONE HCL PF 1 MG/ML IJ SOLN
0.2500 mg | INTRAMUSCULAR | Status: DC | PRN
  Administered 2014-01-15 (×4): 0.5 mg via INTRAVENOUS

## 2014-01-15 MED ORDER — GLYCOPYRROLATE 0.2 MG/ML IJ SOLN
INTRAMUSCULAR | Status: DC | PRN
  Administered 2014-01-15: 0.6 mg via INTRAVENOUS

## 2014-01-15 MED ORDER — CEFAZOLIN SODIUM-DEXTROSE 2-3 GM-% IV SOLR
2.0000 g | INTRAVENOUS | Status: AC
  Administered 2014-01-15: 2 g via INTRAVENOUS

## 2014-01-15 MED ORDER — HYDROMORPHONE HCL PF 1 MG/ML IJ SOLN
INTRAMUSCULAR | Status: DC | PRN
  Administered 2014-01-15: 1 mg via INTRAVENOUS

## 2014-01-15 MED ORDER — LIDOCAINE HCL (CARDIAC) 20 MG/ML IV SOLN
INTRAVENOUS | Status: DC | PRN
  Administered 2014-01-15: 30 mg via INTRAVENOUS
  Administered 2014-01-15: 70 mg via INTRAVENOUS

## 2014-01-15 MED ORDER — ACETAMINOPHEN 325 MG PO TABS: 650.0000 mg | ORAL_TABLET | ORAL | Status: DC | PRN

## 2014-01-15 MED ORDER — ONDANSETRON HCL 4 MG/2ML IJ SOLN
INTRAMUSCULAR | Status: AC
  Filled 2014-01-15: qty 2

## 2014-01-15 MED ORDER — ROCURONIUM BROMIDE 100 MG/10ML IV SOLN
INTRAVENOUS | Status: AC
  Filled 2014-01-15: qty 1

## 2014-01-15 MED ORDER — DEXAMETHASONE SODIUM PHOSPHATE 10 MG/ML IJ SOLN
INTRAMUSCULAR | Status: DC | PRN
  Administered 2014-01-15: 4 mg via INTRAVENOUS

## 2014-01-15 MED ORDER — CEFAZOLIN SODIUM-DEXTROSE 2-3 GM-% IV SOLR
INTRAVENOUS | Status: AC
  Filled 2014-01-15: qty 50

## 2014-01-15 MED ORDER — OXYCODONE-ACETAMINOPHEN 5-325 MG PO TABS
1.0000 | ORAL_TABLET | ORAL | Status: DC | PRN
  Administered 2014-01-15: 1 via ORAL
  Administered 2014-01-15: 2 via ORAL
  Administered 2014-01-15: 1 via ORAL
  Administered 2014-01-15: 2 via ORAL
  Administered 2014-01-16 (×2): 1 via ORAL
  Filled 2014-01-15: qty 1
  Filled 2014-01-15: qty 2
  Filled 2014-01-15 (×2): qty 1
  Filled 2014-01-15: qty 2
  Filled 2014-01-15: qty 1

## 2014-01-15 MED ORDER — KETOROLAC TROMETHAMINE 30 MG/ML IJ SOLN: INTRAMUSCULAR | Status: DC | PRN

## 2014-01-15 MED ORDER — GLYCOPYRROLATE 0.2 MG/ML IJ SOLN
INTRAMUSCULAR | Status: AC
  Filled 2014-01-15: qty 3

## 2014-01-15 MED ORDER — PROMETHAZINE HCL 25 MG/ML IJ SOLN: 6.2500 mg | INTRAMUSCULAR | Status: DC | PRN

## 2014-01-15 MED ORDER — HYDROMORPHONE HCL PF 1 MG/ML IJ SOLN
INTRAMUSCULAR | Status: AC
  Filled 2014-01-15: qty 1

## 2014-01-15 MED ORDER — SCOPOLAMINE 1 MG/3DAYS TD PT72
MEDICATED_PATCH | TRANSDERMAL | Status: AC
  Administered 2014-01-15: 1.5 mg via TRANSDERMAL
  Filled 2014-01-15: qty 1

## 2014-01-15 MED ORDER — ONDANSETRON HCL 4 MG/2ML IJ SOLN: 4.0000 mg | Freq: Four times a day (QID) | INTRAMUSCULAR | Status: DC | PRN

## 2014-01-15 MED ORDER — FAMOTIDINE 20 MG PO TABS
ORAL_TABLET | ORAL | Status: AC
  Administered 2014-01-15: 20 mg via ORAL
  Filled 2014-01-15: qty 1

## 2014-01-15 MED ORDER — LACTATED RINGERS IV SOLN
INTRAVENOUS | Status: DC
  Administered 2014-01-15 – 2014-01-16 (×3): via INTRAVENOUS

## 2014-01-15 MED ORDER — NEOSTIGMINE METHYLSULFATE 10 MG/10ML IV SOLN
INTRAVENOUS | Status: AC
  Filled 2014-01-15: qty 1

## 2014-01-15 MED ORDER — MIDAZOLAM HCL 2 MG/2ML IJ SOLN
INTRAMUSCULAR | Status: AC
  Filled 2014-01-15: qty 2

## 2014-01-15 MED ORDER — FENTANYL CITRATE 0.05 MG/ML IJ SOLN
INTRAMUSCULAR | Status: DC | PRN
  Administered 2014-01-15 (×3): 50 ug via INTRAVENOUS
  Administered 2014-01-15: 100 ug via INTRAVENOUS

## 2014-01-15 MED ORDER — KETOROLAC TROMETHAMINE 30 MG/ML IJ SOLN: 15.0000 mg | Freq: Once | INTRAMUSCULAR | Status: DC | PRN

## 2014-01-15 MED ORDER — LIDOCAINE HCL (CARDIAC) 20 MG/ML IV SOLN
INTRAVENOUS | Status: AC
  Filled 2014-01-15: qty 5

## 2014-01-15 MED ORDER — MIDAZOLAM HCL 2 MG/2ML IJ SOLN
INTRAMUSCULAR | Status: DC | PRN
  Administered 2014-01-15: 2 mg via INTRAVENOUS

## 2014-01-15 MED ORDER — PROPOFOL 10 MG/ML IV EMUL
INTRAVENOUS | Status: AC
  Filled 2014-01-15: qty 20

## 2014-01-15 MED ORDER — FLUMAZENIL 0.5 MG/5ML IV SOLN
INTRAVENOUS | Status: DC | PRN
  Administered 2014-01-15: 0.2 mg via INTRAVENOUS

## 2014-01-15 MED ORDER — NEOSTIGMINE METHYLSULFATE 10 MG/10ML IV SOLN
INTRAVENOUS | Status: DC | PRN
  Administered 2014-01-15: 3 mg via INTRAVENOUS

## 2014-01-15 MED ORDER — BUPIVACAINE HCL (PF) 0.25 % IJ SOLN
INTRAMUSCULAR | Status: AC
  Filled 2014-01-15: qty 30

## 2014-01-15 MED ORDER — BUPIVACAINE HCL (PF) 0.25 % IJ SOLN
INTRAMUSCULAR | Status: DC | PRN
  Administered 2014-01-15: 10 mL

## 2014-01-15 MED ORDER — FENTANYL CITRATE 0.05 MG/ML IJ SOLN
INTRAMUSCULAR | Status: AC
  Filled 2014-01-15: qty 5

## 2014-01-15 MED ORDER — LACTATED RINGERS IV SOLN
INTRAVENOUS | Status: DC
  Administered 2014-01-15 (×3): via INTRAVENOUS

## 2014-01-15 MED ORDER — MENTHOL 3 MG MT LOZG: 1.0000 | LOZENGE | OROMUCOSAL | Status: DC | PRN

## 2014-01-15 MED ORDER — ROCURONIUM BROMIDE 100 MG/10ML IV SOLN
INTRAVENOUS | Status: DC | PRN
  Administered 2014-01-15: 40 mg via INTRAVENOUS

## 2014-01-15 MED ORDER — DEXAMETHASONE SODIUM PHOSPHATE 10 MG/ML IJ SOLN
INTRAMUSCULAR | Status: AC
  Filled 2014-01-15: qty 1

## 2014-01-15 MED ORDER — SCOPOLAMINE 1 MG/3DAYS TD PT72
1.0000 | MEDICATED_PATCH | Freq: Once | TRANSDERMAL | Status: DC
  Administered 2014-01-15: 1.5 mg via TRANSDERMAL

## 2014-01-15 MED ORDER — LACTATED RINGERS IR SOLN
Status: DC | PRN
  Administered 2014-01-15: 1

## 2014-01-15 MED ORDER — KETOROLAC TROMETHAMINE 30 MG/ML IJ SOLN
INTRAMUSCULAR | Status: DC | PRN
  Administered 2014-01-15: 30 mg via INTRAVENOUS

## 2014-01-15 MED ORDER — ONDANSETRON HCL 4 MG/2ML IJ SOLN
INTRAMUSCULAR | Status: DC | PRN
  Administered 2014-01-15: 4 mg via INTRAVENOUS

## 2014-01-15 MED ORDER — LORATADINE 10 MG PO TABS
10.0000 mg | ORAL_TABLET | Freq: Every day | ORAL | Status: DC
  Administered 2014-01-15 – 2014-01-16 (×2): 10 mg via ORAL
  Filled 2014-01-15 (×2): qty 1

## 2014-01-15 MED ORDER — MEPERIDINE HCL 25 MG/ML IJ SOLN: 6.2500 mg | INTRAMUSCULAR | Status: DC | PRN

## 2014-01-15 MED ORDER — PROPOFOL 10 MG/ML IV BOLUS
INTRAVENOUS | Status: DC | PRN
  Administered 2014-01-15: 180 mg via INTRAVENOUS

## 2014-01-15 MED ORDER — ONDANSETRON HCL 4 MG PO TABS
4.0000 mg | ORAL_TABLET | Freq: Four times a day (QID) | ORAL | Status: DC | PRN
  Administered 2014-01-15: 4 mg via ORAL
  Filled 2014-01-15: qty 1

## 2014-01-15 MED ORDER — HYDROMORPHONE HCL PF 1 MG/ML IJ SOLN
0.6000 mg | Freq: Once | INTRAMUSCULAR | Status: AC
  Administered 2014-01-15: 0.6 mg via INTRAVENOUS
  Filled 2014-01-15: qty 1

## 2014-01-15 MED ORDER — DIPHENHYDRAMINE HCL 25 MG PO CAPS
25.0000 mg | ORAL_CAPSULE | Freq: Four times a day (QID) | ORAL | Status: DC | PRN
  Administered 2014-01-15 – 2014-01-16 (×3): 25 mg via ORAL
  Filled 2014-01-15 (×3): qty 1

## 2014-01-15 MED ORDER — FAMOTIDINE 20 MG PO TABS
20.0000 mg | ORAL_TABLET | Freq: Once | ORAL | Status: AC
  Administered 2014-01-15: 20 mg via ORAL

## 2014-01-15 SURGICAL SUPPLY — 41 items
BLADE SURG 10 STRL SS (BLADE) ×4 IMPLANT
BLADE SURG 11 STRL SS (BLADE) ×8 IMPLANT
CATH ROBINSON RED A/P 16FR (CATHETERS) ×4 IMPLANT
CLOTH BEACON ORANGE TIMEOUT ST (SAFETY) ×4 IMPLANT
CONT PATH 16OZ SNAP LID 3702 (MISCELLANEOUS) ×4 IMPLANT
COVER TABLE BACK 60X90 (DRAPES) ×4 IMPLANT
DERMABOND ADVANCED (GAUZE/BANDAGES/DRESSINGS) ×2
DERMABOND ADVANCED .7 DNX12 (GAUZE/BANDAGES/DRESSINGS) ×2 IMPLANT
DRSG COVADERM PLUS 2X2 (GAUZE/BANDAGES/DRESSINGS) ×4 IMPLANT
DRSG OPSITE POSTOP 3X4 (GAUZE/BANDAGES/DRESSINGS) ×4 IMPLANT
DURAPREP 26ML APPLICATOR (WOUND CARE) ×4 IMPLANT
ELECT REM PT RETURN 9FT ADLT (ELECTROSURGICAL) ×4
ELECTRODE REM PT RTRN 9FT ADLT (ELECTROSURGICAL) ×2 IMPLANT
GLOVE BIO SURGEON STRL SZ7 (GLOVE) ×8 IMPLANT
GLOVE BIOGEL PI IND STRL 6.5 (GLOVE) ×2 IMPLANT
GLOVE BIOGEL PI IND STRL 7.0 (GLOVE) ×4 IMPLANT
GLOVE BIOGEL PI INDICATOR 6.5 (GLOVE) ×2
GLOVE BIOGEL PI INDICATOR 7.0 (GLOVE) ×4
GOWN STRL REUS W/ TWL LRG LVL3 (GOWN DISPOSABLE) ×14 IMPLANT
GOWN STRL REUS W/TWL LRG LVL3 (GOWN DISPOSABLE) ×14
NEEDLE INSUFFLATION 120MM (ENDOMECHANICALS) ×4 IMPLANT
NS IRRIG 1000ML POUR BTL (IV SOLUTION) ×4 IMPLANT
PACK LAVH (CUSTOM PROCEDURE TRAY) ×4 IMPLANT
PROTECTOR NERVE ULNAR (MISCELLANEOUS) ×4 IMPLANT
SEALER TISSUE G2 CVD JAW 45CM (ENDOMECHANICALS) ×4 IMPLANT
SET IRRIG TUBING LAPAROSCOPIC (IRRIGATION / IRRIGATOR) ×4 IMPLANT
SUT MON AB 2-0 CT1 27 (SUTURE) ×12 IMPLANT
SUT VIC AB 0 CT1 18XCR BRD8 (SUTURE) ×4 IMPLANT
SUT VIC AB 0 CT1 27 (SUTURE) ×2
SUT VIC AB 0 CT1 27XBRD ANBCTR (SUTURE) ×2 IMPLANT
SUT VIC AB 0 CT1 36 (SUTURE) ×4 IMPLANT
SUT VIC AB 0 CT1 8-18 (SUTURE) ×4
SUT VICRYL 0 UR6 27IN ABS (SUTURE) ×4 IMPLANT
SUT VICRYL 1 TIES 12X18 (SUTURE) ×4 IMPLANT
SUT VICRYL 4-0 PS2 18IN ABS (SUTURE) ×4 IMPLANT
TOWEL OR 17X24 6PK STRL BLUE (TOWEL DISPOSABLE) ×8 IMPLANT
TRAY FOLEY CATH 14FR (SET/KITS/TRAYS/PACK) ×4 IMPLANT
TROCAR BALLN 12MMX100 BLUNT (TROCAR) ×4 IMPLANT
TROCAR OPTI TIP 5M 100M (ENDOMECHANICALS) ×4 IMPLANT
WARMER LAPAROSCOPE (MISCELLANEOUS) ×4 IMPLANT
WATER STERILE IRR 1000ML POUR (IV SOLUTION) ×4 IMPLANT

## 2014-01-15 NOTE — H&P (Signed)
  History and physical exam unchanged 

## 2014-01-15 NOTE — Progress Notes (Signed)
Patient ID: Rachel Reid, female   DOB: June 13, 1976, 38 y.o.   MRN: [id number] AFF VSS AB SOFT DRESSING DRY GOOD UO

## 2014-01-15 NOTE — Transfer of Care (Signed)
Immediate Anesthesia Transfer of Care Note  Patient: Rachel Reid  Procedure(s) Performed: Procedure(s): LAPAROSCOPIC ASSISTED VAGINAL HYSTERECTOMY (N/A) BILATERAL SALPINGECTOMY (Bilateral)  Patient Location: PACU  Anesthesia Type:General  Level of Consciousness: awake, sedated and patient cooperative  Airway & Oxygen Therapy: Patient Spontanous Breathing and Patient connected to nasal cannula oxygen  Post-op Assessment: Report given to PACU RN and Post -op Vital signs reviewed and stable  Post vital signs: Reviewed and stable  Complications: No apparent anesthesia complications

## 2014-01-15 NOTE — Anesthesia Preprocedure Evaluation (Signed)
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          Cardiovascular negative cardio ROS  Rhythm:Regular     Neuro/Psych negative neurological ROS  negative psych ROS   GI/Hepatic Neg liver ROS, GERD-  Medicated and Controlled,  Endo/Other  negative endocrine ROS  Renal/GU negative Renal ROS     Musculoskeletal   Abdominal   Peds  Hematology  (+) anemia ,   Anesthesia Other Findings   Reproductive/Obstetrics                           Anesthesia Physical Anesthesia Plan  ASA: II  Anesthesia Plan: General   Post-op Pain Management:    Induction: Intravenous  Airway Management Planned: Oral ETT  Additional Equipment: None  Intra-op Plan:   Post-operative Plan: Extubation in OR  Informed Consent: I have reviewed the patients History and Physical, chart, labs and discussed the procedure including the risks, benefits and alternatives for the proposed anesthesia with the patient or authorized representative who has indicated his/her understanding and acceptance.   Dental advisory given  Plan Discussed with:   Anesthesia Plan Comments:         Anesthesia Quick Evaluation

## 2014-01-15 NOTE — Brief Op Note (Signed)
01/15/2014  9:05 AM  PATIENT:  Rachel Reid  38 y.o. female  PRE-OPERATIVE DIAGNOSIS:  Endometriosis, Dyspareunia  POST-OPERATIVE DIAGNOSIS:  Endometriosis, Dyspareunia  PROCEDURE:  Procedure(s): LAPAROSCOPIC ASSISTED VAGINAL HYSTERECTOMY (N/A) BILATERAL SALPINGECTOMY (Bilateral)  SURGEON:  Surgeon(s) and Role:    * Darlyn Chamber, MD - Primary    * Linda Hedges, DO - Assisting  PHYSICIAN ASSISTANT:   ASSISTANTS: megan morris   ANESTHESIA:   local and general  EBL:  Total I/O In: 1000 [I.V.:1000] Out: 1050 [Urine:450; Blood:600]  BLOOD ADMINISTERED:none  DRAINS: Urinary Catheter (Foley)   LOCAL MEDICATIONS USED:  MARCAINE     SPECIMEN:  Source of Specimen:  uterus and both tubes  DISPOSITION OF SPECIMEN:  PATHOLOGY  COUNTS:  YES  TOURNIQUET:  * No tourniquets in log *  DICTATION: .Other Dictation: Dictation Number [id number]  PLAN OF CARE: Admit for overnight observation  PATIENT DISPOSITION:  PACU - hemodynamically stable.   Delay start of Pharmacological VTE agent (>24hrs) due to surgical blood loss or risk of bleeding: no

## 2014-01-15 NOTE — Addendum Note (Signed)
Addendum created 01/15/14 1415 by Brock Ra, CRNA   Modules edited: Notes Section   Notes Section:  File: [id number]

## 2014-01-15 NOTE — Anesthesia Postprocedure Evaluation (Signed)
  Anesthesia Post-op Note  Patient: Rachel Reid  Procedure(s) Performed: Procedure(s): LAPAROSCOPIC ASSISTED VAGINAL HYSTERECTOMY (N/A) BILATERAL SALPINGECTOMY (Bilateral)  Patient Location: [facility]  Anesthesia Type:General  Level of Consciousness: awake, alert , oriented and patient cooperative  Airway and Oxygen Therapy: Patient Spontanous Breathing and Patient connected to nasal cannula oxygen  Post-op Pain: mild  Post-op Assessment: Post-op Vital signs reviewed, Patient's Cardiovascular Status Stable, Respiratory Function Stable, Patent Airway, No signs of Nausea or vomiting, Adequate PO intake and Pain level controlled  Post-op Vital Signs: Reviewed and stable  Last Vitals:  Filed Vitals:   01/15/14 1120  BP: 133/78  Pulse: 77  Temp:   Resp: 18    Complications: No apparent anesthesia complications

## 2014-01-15 NOTE — Op Note (Signed)
Patient name  Rachel Reid, Rachel Reid DICTATION#  [id number] CSN# [id number]   [facility], MD 01/15/2014 9:06 AM

## 2014-01-15 NOTE — Anesthesia Postprocedure Evaluation (Signed)
Anesthesia Post Note  Patient: Rachel Reid  Procedure(s) Performed: Procedure(s) (LRB): LAPAROSCOPIC ASSISTED VAGINAL HYSTERECTOMY (N/A) BILATERAL SALPINGECTOMY (Bilateral)  Anesthesia type: General  Patient location: PACU  Post pain: Pain level controlled  Post assessment: Post-op Vital signs reviewed  Last Vitals:  Filed Vitals:   01/15/14 0930  BP: 131/80  Pulse: 89  Temp:   Resp: 14    Post vital signs: Reviewed  Level of consciousness: sedated  Complications: No apparent anesthesia complications

## 2014-01-16 ENCOUNTER — Encounter (HOSPITAL_COMMUNITY): Payer: Self-pay | Admitting: Obstetrics and Gynecology

## 2014-01-16 LAB — CBC
HCT: 29.3 % — ABNORMAL LOW (ref 36.0–46.0)
Hemoglobin: 9.7 g/dL — ABNORMAL LOW (ref 12.0–15.0)
MCH: 29 pg (ref 26.0–34.0)
MCHC: 33.1 g/dL (ref 30.0–36.0)
MCV: 87.7 fL (ref 78.0–100.0)
PLATELETS: 166 10*3/uL (ref 150–400)
RBC: 3.34 MIL/uL — AB (ref 3.87–5.11)
RDW: 13.2 % (ref 11.5–15.5)
WBC: 7.3 10*3/uL (ref 4.0–10.5)

## 2014-01-16 MED ORDER — RANITIDINE HCL 150 MG PO TABS
150.0000 mg | ORAL_TABLET | Freq: Two times a day (BID) | ORAL | Status: DC
  Administered 2014-01-16: 150 mg via ORAL
  Filled 2014-01-16: qty 1

## 2014-01-16 MED ORDER — OXYCODONE-ACETAMINOPHEN 7.5-325 MG PO TABS
1.0000 | ORAL_TABLET | ORAL | Status: DC | PRN
Start: 2014-01-16 — End: 2017-03-31

## 2014-01-16 NOTE — Progress Notes (Signed)
Pt discharged to home with husband and daughters.  Condition stable.  Pt to car via wheelchair with L. Graylon Good, NT.  No equipment for home ordered at discharge.

## 2014-01-16 NOTE — Op Note (Signed)
Rachel Reid, OGLESBY NO.:  [id number]  MEDICAL RECORD NO.:  [id number]  LOCATION:  1517                          FACILITY:  [facility]  PHYSICIAN:  Darlyn Chamber, M.D.   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  OPERATIVE PROCEDURE:  Laparoscopic-assisted vaginal hysterectomy with removal of both fallopian tubes.  SURGEON:  Darlyn Chamber, M.D.  ASSISTANT:  Angie Fava.  BLOOD LOSS:  800 mL.  PACKS:  None.  DRAINS:  Urethral Foley.  INTRAOPERATIVE BLOOD PLACEMENT:  None.  COMPLICATIONS:  None.  INDICATION:  Dictated in history and physical.  PROCEDURE:  As follows.  The patient was taken to OR, placed in supine position.  After satisfactory level of general endotracheal anesthesia was obtained, the patient was placed in a dorsal lithotomy position using Allen stirrups.  Perineum and vagina were cleansed out with Betadine.  Abdomen was cleaned with DuraPrep.  After this point in time, a Hulka tenaculum was put in place and bladder was emptied by in and out catheterization.  After satisfactory time of drying, the patient was draped as sterile field.  Subumbilical incision was made with a knife. The incision was carried through subcutaneous tissue.  Fascia was identified and entered sharply.  The peritoneum was entered with blunt finger pressure.  Open laparoscopic trocar was put in place and secured. The abdomen was insufflated with carbon dioxide.  Laparoscope was introduced.  There was no evidence of injury to adjacent organs.  A 5-mm trocar was put in place under direct visualization.  Uterus was normal. She had a previous bilateral tubal ligation.  Both ovaries appeared to be normal.  I saw no active endometriosis.   Appendix was normal.  Upper abdomen including liver and tip of the gallbladder were clear.  Next, using an EnSeal, the distal end of each fallopian tube was cauterized and incised from the ovary, and was removed through the subumbilical port and sent to Pathology.  We then went to the right side, the right utero-ovarian pedicle was cauterized and incised.  The right round ligament was cauterized and incised.  We went to the left side, left utero-ovarian pedicle was cauterized and incised and the left round ligament was cauterized and incised.  At this point in time, the decision was to go vaginally.  Laparoscope was then removed.  Abdomen was deflated with carbon dioxide. Legs were repositioned.  Hulka tenaculum was then removed.  Weighted speculum was placed in vaginal vault.  The cervix was grasped with Ardis Hughs tenaculum.  Cul-de-sac was entered sharply.  Both uterosacral ligaments were clamped, cut, and suture ligated with 0-Vicryl. Reflection of vaginal mucosa anteriorly was incised and bladder dissected superiorly.  Paracervical tissue was clamped, tied with suture ligature of 0-Vicryl.  Vesicouterine space was identified and entered sharply, and retractors put in place.  Using clamp cut and tie technique with suture ligature of 0-Vicryl, the parametrium was serially separated from sized uterus.  Uterus was then clipped,  held pedicles were clamped and cut.  Uterus was passed off the operative field.  Held pedicles were secured with free ties of 0-Vicryl.  Posterior vaginal cuff was run with a running locking suture of 2-0 Monocryl.  At this point in time, the vaginal mucosa was reapproximated in the midline with interrupted sutures of 2-0 Monocryl.  Foley was placed to straight drain achieving adequate amount of clear urine.  The patient's legs were repositioned.  Laparoscope was reintroduced in the abdominal cavity.  We had the Najat suction and irrigation device. We did have some  bleeding from the left ovary and left round ligament. These were brought under control with the bipolar.  Some oozing from the vaginal cuff was also brought under control with the bipolar.  The right ovary was hemostatically intact.  We removed clots, thoroughly irrigated, deflated the abdomen.  We still had some bleeding from the left ovarian area, it was elevated and again using the bipolar, we cauterized that area.  At this point in time, we had good hemostasis. Again, deflated and reinflated, and no active bleeding was encountered. The abdomen was deflated with carbon dioxide.  All trocars were removed. Subumbilical fascia was closed with figure-of-eight of 0-Vicryl.  Skin was closed with interrupted subcuticular 4-0 Vicryl.  Suprapubic incision was closed with Dermabond.  At this point in time, the patient was taken out of dorsal lithotomy position.  Once alert, extubated, transferred to the recovery room in good condition.  Sponge, instrument, and needle count was reported as correct by circulating nurse x2.  Foley catheter remained clear at the time of closure.  The patient tolerated the procedure well and was sent to the recovery room in good condition.     Darlyn Chamber, M.D.     JSM/MEDQ  D:  01/15/2014  T:  01/15/2014  Job:  [id number]

## 2014-01-16 NOTE — Progress Notes (Signed)
1 Day Post-Op Procedure(s) (LRB): LAPAROSCOPIC ASSISTED VAGINAL HYSTERECTOMY (N/A) BILATERAL SALPINGECTOMY (Bilateral)  Subjective: Patient reports tolerating PO and no problems voiding.    Objective: I have reviewed patient's vital signs and labs.  General: alert GI: soft, non-tender; bowel sounds normal; no masses,  no organomegaly Vaginal Bleeding: minimal  Assessment: s/p Procedure(s): LAPAROSCOPIC ASSISTED VAGINAL HYSTERECTOMY (N/A) BILATERAL SALPINGECTOMY (Bilateral): stable  Plan: Discharge home  LOS: 1 day    Jalie Eiland S 01/16/2014, 9:21 AM

## 2014-01-16 NOTE — Discharge Summary (Signed)
See dictated note.

## 2014-01-17 NOTE — Discharge Summary (Signed)
[name], [name] NO.:  [id number]  MEDICAL RECORD NO.:  [id number]  LOCATION:  4580                          FACILITY:  [facility]  PHYSICIAN:  Darlyn Chamber, M.D.   DATE OF BIRTH:  [birth year]  DATE OF ADMISSION:  01/15/2014 DATE OF DISCHARGE:  01/16/2014                              DISCHARGE SUMMARY   PREOPERATIVE DIAGNOSIS:  Pelvic pain secondary to possible endometriosis.  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  PROCEDURES:  Laparoscopic-assisted vaginal hysterectomy with bilateral salpingo-oophorectomy.  For complete history and physical, please see dictated note.  COURSE IN THE HOSPITAL:  The patient underwent above-noted surgery.  She did well postoperatively.  Her postoperative hemoglobin was 9.7.  She was discharged home on her first postoperative day.  At that time, she was afebrile.  Stable vital signs.  Abdomen was soft with minimal tenderness.  All incisions were intact.  She was having minimal vaginal spotting, was voiding without difficulty, tolerating a regular diet.  In terms of complications, none were encountered during her stay in the hospital.  DISPOSITION:  The patient was discharged home in stable condition.  The patient to avoid heavy lifting, vaginal entrance, or driving a car. She was instructed to call with signs of infection, which would be fever.  Any nausea and vomiting should be reported.  Active vaginal bleeding should be reported.  Excessive pain and discomfort should be reported.  She is instructed on signs and symptoms of deep venous thrombosis and pulmonary embolus.  The office will call tomorrow and make followup arrangements in approximately 1-2 weeks.  Discharged home on Percocet needed for pain.     Darlyn Chamber, M.D.     JSM/MEDQ  D:  01/16/2014  T:  01/16/2014  Job:  [id number]

## 2014-04-24 ENCOUNTER — Other Ambulatory Visit: Payer: Self-pay | Admitting: Obstetrics and Gynecology

## 2014-04-27 LAB — CYTOLOGY - PAP

## 2015-11-30 ENCOUNTER — Ambulatory Visit (HOSPITAL_COMMUNITY)
Admission: EM | Admit: 2015-11-30 | Discharge: 2015-11-30 | Disposition: A | Discharge status: Home / Self Care | Payer: Managed Care, Other (non HMO) | Attending: Family Medicine | Admitting: Family Medicine

## 2015-11-30 ENCOUNTER — Encounter (HOSPITAL_COMMUNITY): Payer: Self-pay | Admitting: Emergency Medicine

## 2015-11-30 DIAGNOSIS — S161XXA Strain of muscle, fascia and tendon at neck level, initial encounter: Secondary | ICD-10-CM | POA: Diagnosis not present

## 2015-11-30 MED ORDER — METHOCARBAMOL 500 MG PO TABS: 500.0000 mg | ORAL_TABLET | Freq: Four times a day (QID) | ORAL | Status: DC

## 2015-11-30 MED ORDER — DICLOFENAC SODIUM 50 MG PO TBEC: 50.0000 mg | DELAYED_RELEASE_TABLET | Freq: Two times a day (BID) | ORAL | Status: DC

## 2015-11-30 NOTE — ED Provider Notes (Signed)
CSN: [id number]     Arrival date & time 11/30/15  1921 History   First MD Initiated Contact with Patient 11/30/15 2144     Chief Complaint  Patient presents with  . Back Pain   (Consider location/radiation/quality/duration/timing/severity/associated sxs/prior Treatment) Patient is a 40 y.o. female presenting with back pain. The history is provided by the patient. No language interpreter was used.  Back Pain Location:  Generalized Quality:  Aching Radiates to:  Does not radiate Pain severity:  Moderate Pain is:  Worse during the day Onset quality:  Gradual Timing:  Constant Progression:  Worsening Chronicity:  New Relieved by:  Nothing Worsened by:  Nothing tried Ineffective treatments:  None tried Associated symptoms: no weakness   Risk factors: not pregnant     Past Medical History  Diagnosis Date  . Seasonal allergies   . SVD (spontaneous vaginal delivery)     x 1  . Environmental allergies   . Endometriosis   . Anemia     hx   Past Surgical History  Procedure Laterality Date  . Tubal ligation      lap tubal  . Wisdom tooth extraction    . Cesarean section      x 1  . Laparoscopic assisted vaginal hysterectomy N/A 01/15/2014    Procedure: LAPAROSCOPIC ASSISTED VAGINAL HYSTERECTOMY;  Surgeon: Darlyn Chamber, MD;  Location: [facility];  Service: Gynecology;  Laterality: N/A;  . Bilateral salpingectomy Bilateral 01/15/2014    Procedure: BILATERAL SALPINGECTOMY;  Surgeon: Darlyn Chamber, MD;  Location: [facility];  Service: Gynecology;  Laterality: Bilateral;   No family history on file. Social History  Substance Use Topics  . Smoking status: Never Smoker   . Smokeless tobacco: Never Used  . Alcohol Use: No   OB History    No data available     Review of Systems  Musculoskeletal: Positive for back pain.  Neurological: Negative for weakness.  All other systems reviewed and are negative.   Allergies  Sulfa drugs cross reactors  Home Medications   Prior to Admission  medications   Medication Sig Start Date End Date Taking? Authorizing Provider  cholecalciferol (VITAMIN D) 1000 UNITS tablet Take 1,000 Units by mouth daily.    Historical Provider, MD  diclofenac (VOLTAREN) 50 MG EC tablet Take 1 tablet (50 mg total) by mouth 2 (two) times daily. 11/30/15   Fransico Meadow, PA-C  fexofenadine (ALLEGRA) 180 MG tablet Take 360 mg by mouth daily. Taking for allergies    Historical Provider, MD  methocarbamol (ROBAXIN) 500 MG tablet Take 1 tablet (500 mg total) by mouth 4 (four) times daily. 11/30/15   Fransico Meadow, PA-C  montelukast (SINGULAIR) 10 MG tablet Take 10 mg by mouth at bedtime.    Historical Provider, MD  Omalizumab Arvid Right [place]) Inject 1 each into the skin every 30 (thirty) days.    Historical Provider, MD  oxyCODONE-acetaminophen (PERCOCET) 7.5-325 MG per tablet Take 1 tablet by mouth every 4 (four) hours as needed for pain. 01/16/14   Arvella Nigh, MD  ranitidine (ZANTAC) 150 MG tablet Take 150 mg by mouth at bedtime.    Historical Provider, MD   Meds Ordered and Administered this Visit  Medications - No data to display  BP 129/77 mmHg  Pulse 76  Temp(Src) 98.7 F (37.1 C) (Oral)  SpO2 100% No data found.   Physical Exam  Constitutional: She is oriented to person, place, and time. She appears well-developed and well-nourished.  HENT:  Head:  Normocephalic.  Eyes: EOM are normal.  Neck: Normal range of motion.  Cardiovascular: Normal rate.   Pulmonary/Chest: Effort normal.  Abdominal: She exhibits no distension.  Musculoskeletal: Normal range of motion.  Neurological: She is alert and oriented to person, place, and time.  Skin: Skin is warm.  Psychiatric: She has a normal mood and affect.  Nursing note and vitals reviewed.   ED Course  Procedures (including critical care time)  Labs Review Labs Reviewed - No data to display  Imaging Review No results found.   Visual Acuity Review  Right Eye Distance:   Left Eye Distance:    Bilateral Distance:    Right Eye Near:   Left Eye Near:    Bilateral Near:         MDM   1. Cervical strain, acute, initial encounter    An After Visit Summary was printed and given to the patient. Meds ordered this encounter  Medications  . diclofenac (VOLTAREN) 50 MG EC tablet    Sig: Take 1 tablet (50 mg total) by mouth 2 (two) times daily.    Dispense:  14 tablet    Refill:  0    Order Specific Question:  Supervising Provider    Answer:  Billy Fischer [phone]  . methocarbamol (ROBAXIN) 500 MG tablet    Sig: Take 1 tablet (500 mg total) by mouth 4 (four) times daily.    Dispense:  20 tablet    Refill:  0    Order Specific Question:  Supervising Provider    Answer:  Ihor Gully D [place], PA-C 11/30/15 2212

## 2015-11-30 NOTE — Discharge Instructions (Signed)
Cervical Sprain  A cervical sprain is an injury in the neck in which the strong, fibrous tissues (ligaments) that connect your neck bones stretch or tear. Cervical sprains can range from mild to severe. Severe cervical sprains can cause the neck vertebrae to be unstable. This can lead to damage of the spinal cord and can result in serious nervous system problems. The amount of time it takes for a cervical sprain to get better depends on the cause and extent of the injury. Most cervical sprains heal in 1 to 3 weeks.  CAUSES   Severe cervical sprains may be caused by:    Contact sport injuries (such as from football, rugby, wrestling, hockey, auto racing, gymnastics, diving, martial arts, or boxing).    Motor vehicle collisions.    Whiplash injuries. This is an injury from a sudden forward and backward whipping movement of the head and neck.   Falls.   Mild cervical sprains may be caused by:    Being in an awkward position, such as while cradling a telephone between your ear and shoulder.    Sitting in a chair that does not offer proper support.    Working at a poorly designed computer station.    Looking up or down for long periods of time.   SYMPTOMS    Pain, soreness, stiffness, or a burning sensation in the front, back, or sides of the neck. This discomfort may develop immediately after the injury or slowly, 24 hours or more after the injury.    Pain or tenderness directly in the middle of the back of the neck.    Shoulder or upper back pain.    Limited ability to move the neck.    Headache.    Dizziness.    Weakness, numbness, or tingling in the hands or arms.    Muscle spasms.    Difficulty swallowing or chewing.    Tenderness and swelling of the neck.   DIAGNOSIS   Most of the time your health care provider can diagnose a cervical sprain by taking your history and doing a physical exam. Your health care provider will ask about previous neck injuries and any known neck  problems, such as arthritis in the neck. X-rays may be taken to find out if there are any other problems, such as with the bones of the neck. Other tests, such as a CT scan or MRI, may also be needed.   TREATMENT   Treatment depends on the severity of the cervical sprain. Mild sprains can be treated with rest, keeping the neck in place (immobilization), and pain medicines. Severe cervical sprains are immediately immobilized. Further treatment is done to help with pain, muscle spasms, and other symptoms and may include:   Medicines, such as pain relievers, numbing medicines, or muscle relaxants.    Physical therapy. This may involve stretching exercises, strengthening exercises, and posture training. Exercises and improved posture can help stabilize the neck, strengthen muscles, and help stop symptoms from returning.   HOME CARE INSTRUCTIONS    Put ice on the injured area.     Put ice in a plastic bag.     Place a towel between your skin and the bag.     Leave the ice on for 15-20 minutes, 3-4 times a day.    If your injury was severe, you may have been given a cervical collar to wear. A cervical collar is a two-piece collar designed to keep your neck from moving while it heals.      Do not remove the collar unless instructed by your health care provider.    If you have long hair, keep it outside of the collar.    Ask your health care provider before making any adjustments to your collar. Minor adjustments may be required over time to improve comfort and reduce pressure on your chin or on the back of your head.    Ifyou are allowed to remove the collar for cleaning or bathing, follow your health care provider's instructions on how to do so safely.    Keep your collar clean by wiping it with mild soap and water and drying it completely. If the collar you have been given includes removable pads, remove them every 1-2 days and hand wash them with soap and water. Allow them to air dry. They should be completely  dry before you wear them in the collar.    If you are allowed to remove the collar for cleaning and bathing, wash and dry the skin of your neck. Check your skin for irritation or sores. If you see any, tell your health care provider.    Do not drive while wearing the collar.    Only take over-the-counter or prescription medicines for pain, discomfort, or fever as directed by your health care provider.    Keep all follow-up appointments as directed by your health care provider.    Keep all physical therapy appointments as directed by your health care provider.    Make any needed adjustments to your workstation to promote good posture.    Avoid positions and activities that make your symptoms worse.    Warm up and stretch before being active to help prevent problems.   SEEK MEDICAL CARE IF:    Your pain is not controlled with medicine.    You are unable to decrease your pain medicine over time as planned.    Your activity level is not improving as expected.   SEEK IMMEDIATE MEDICAL CARE IF:    You develop any bleeding.   You develop stomach upset.   You have signs of an allergic reaction to your medicine.    Your symptoms get worse.    You develop new, unexplained symptoms.    You have numbness, tingling, weakness, or paralysis in any part of your body.   MAKE SURE YOU:    Understand these instructions.   Will watch your condition.   Will get help right away if you are not doing well or get worse.     This information is not intended to replace advice given to you by your health care provider. Make sure you discuss any questions you have with your health care provider.     Document Released: 03/29/2007 Document Revised: 06/06/2013 Document Reviewed: 12/07/2012  [employer]vier Interactive Patient Education 2016 [employer]

## 2015-11-30 NOTE — ED Notes (Signed)
Patient has neck, arm and back pain.  Patient has been taking advil, no helping.

## 2017-03-31 ENCOUNTER — Encounter (HOSPITAL_COMMUNITY): Payer: Self-pay | Admitting: Emergency Medicine

## 2017-03-31 ENCOUNTER — Emergency Department (HOSPITAL_COMMUNITY)
Admission: EM | Admit: 2017-03-31 | Discharge: 2017-03-31 | Disposition: A | Discharge status: Home / Self Care | Payer: Managed Care, Other (non HMO) | Attending: Emergency Medicine | Admitting: Emergency Medicine

## 2017-03-31 DIAGNOSIS — Z79899 Other long term (current) drug therapy: Secondary | ICD-10-CM | POA: Insufficient documentation

## 2017-03-31 DIAGNOSIS — R531 Weakness: Secondary | ICD-10-CM

## 2017-03-31 LAB — BASIC METABOLIC PANEL
ANION GAP: 9 (ref 5–15)
BUN: 13 mg/dL (ref 6–20)
CHLORIDE: 105 mmol/L (ref 101–111)
CO2: 22 mmol/L (ref 22–32)
CREATININE: 0.74 mg/dL (ref 0.44–1.00)
Calcium: 9 mg/dL (ref 8.9–10.3)
Glucose, Bld: 102 mg/dL — ABNORMAL HIGH (ref 65–99)
POTASSIUM: 3.7 mmol/L (ref 3.5–5.1)
Sodium: 136 mmol/L (ref 135–145)

## 2017-03-31 LAB — URINALYSIS, ROUTINE W REFLEX MICROSCOPIC
Bilirubin Urine: NEGATIVE
Glucose, UA: NEGATIVE mg/dL
HGB URINE DIPSTICK: NEGATIVE
Ketones, ur: NEGATIVE mg/dL
LEUKOCYTES UA: NEGATIVE
NITRITE: NEGATIVE
PROTEIN: NEGATIVE mg/dL
Specific Gravity, Urine: 1.01 (ref 1.005–1.030)
pH: 7 (ref 5.0–8.0)

## 2017-03-31 LAB — CBC
HCT: 37.6 % (ref 36.0–46.0)
Hemoglobin: 12.7 g/dL (ref 12.0–15.0)
MCH: 29.5 pg (ref 26.0–34.0)
MCHC: 33.8 g/dL (ref 30.0–36.0)
MCV: 87.2 fL (ref 78.0–100.0)
PLATELETS: 201 10*3/uL (ref 150–400)
RBC: 4.31 MIL/uL (ref 3.87–5.11)
RDW: 12.9 % (ref 11.5–15.5)
WBC: 4.5 10*3/uL (ref 4.0–10.5)

## 2017-03-31 MED ORDER — SODIUM CHLORIDE 0.9 % IV BOLUS (SEPSIS)
1000.0000 mL | Freq: Once | INTRAVENOUS | Status: AC
  Administered 2017-03-31: 1000 mL via INTRAVENOUS

## 2017-03-31 NOTE — ED Triage Notes (Signed)
Patient complaining of lightheaded, weak, and a headache. Patient states this started on Friday. Patient states she feels nauseas but no vomiting or diarrhea.

## 2017-03-31 NOTE — Discharge Instructions (Signed)
Please read instructions below.  You may have a viral illness.  You can take tylenol as needed for headache or body aches.  Drink plenty of water.  Follow up with your primary care provider if symptoms persist.  Return to the ER for new or concerning symptoms.

## 2017-03-31 NOTE — ED Provider Notes (Signed)
[facility] DEPT Provider Note   CSN: [id number] Arrival date & time: 03/31/17  2423     History   Chief Complaint Chief Complaint  Patient presents with  . Weakness  . Headache    HPI Rachel Reid is a 41 y.o. female w PMHx anemia, seasonal allergies, endometriosis, presenting to the ED with 5 days of intermittent generalized weakness and lightheadedness since Friday. She states on Friday she began feeling weak all of a sudden and slightly off balance. She states since Friday she has been having intermittent feelings of a mild dull posterior headache, nausea and generalized fatigue. She states she thinks she may be developing vertigo and wanted to get checked out. Also states she works at a daycare. She denies URI sx, vision changes, dizziness, chest pain, SOB, cough, abdominal pain, melena, urinary sx, vaginal bleeding or discharge, rash, recent insect or tick bite, or any other complaints. In ED, denies current HA or nausea.  The history is provided by the patient.    Past Medical History:  Diagnosis Date  . Anemia    hx  . Endometriosis   . Environmental allergies   . Seasonal allergies   . SVD (spontaneous vaginal delivery)    x 1    Patient Active Problem List   Diagnosis Date Noted  . Endometriosis 01/15/2014    Past Surgical History:  Procedure Laterality Date  . BILATERAL SALPINGECTOMY Bilateral 01/15/2014   Procedure: BILATERAL SALPINGECTOMY;  Surgeon: Darlyn Chamber, MD;  Location: [facility];  Service: Gynecology;  Laterality: Bilateral;  . CESAREAN SECTION     x 1  . LAPAROSCOPIC ASSISTED VAGINAL HYSTERECTOMY N/A 01/15/2014   Procedure: LAPAROSCOPIC ASSISTED VAGINAL HYSTERECTOMY;  Surgeon: Darlyn Chamber, MD;  Location: [facility];  Service: Gynecology;  Laterality: N/A;  . TUBAL LIGATION     lap tubal  . WISDOM TOOTH EXTRACTION      OB History    No data available       Home Medications    Prior to Admission medications     Medication Sig Start Date End Date Taking? Authorizing Provider  cholecalciferol (VITAMIN D) 1000 UNITS tablet Take 1,000 Units by mouth daily.   Yes [provider]  Multiple Vitamin (MULTIVITAMIN WITH MINERALS) TABS tablet Take 1 tablet by mouth daily.   Yes [provider]  Omalizumab Arvid Right ) Inject 1 each into the skin every 30 (thirty) days.   Yes [provider]    Family History History reviewed. No pertinent family history.  Social History Social History  Substance Use Topics  . Smoking status: Never Smoker  . Smokeless tobacco: Never Used  . Alcohol use No     Allergies   Sulfa drugs cross reactors   Review of Systems Review of Systems  Constitutional: Negative for chills and fever.  HENT: Negative for congestion, ear pain, rhinorrhea and sore throat.   Eyes: Negative for photophobia and visual disturbance.  Respiratory: Negative for shortness of breath.   Cardiovascular: Negative for chest pain and palpitations.  Gastrointestinal: Positive for nausea (intermittent). Negative for abdominal pain, blood in stool and vomiting.  Genitourinary: Negative for dysuria, flank pain, frequency, vaginal bleeding and vaginal discharge.  Skin: Negative for rash.  Allergic/Immunologic: Negative for immunocompromised state.  Neurological: Positive for weakness (generalized), light-headedness and headaches. Negative for dizziness and syncope.  Psychiatric/Behavioral: Negative for confusion.     Physical Exam Updated Vital Signs BP 125/83 (BP Location: Right Arm)   Pulse  80   Temp 98.2 F (36.8 C) (Oral)   Resp 16   LMP 01/05/2014   SpO2 99%   Physical Exam  Constitutional: She appears well-developed and well-nourished. No distress.  Well-appearing  HENT:  Head: Normocephalic and atraumatic.  Right Ear: Hearing, tympanic membrane, external ear and ear canal normal.  Left Ear: Hearing, tympanic membrane, external ear and ear canal normal.   Nose: Nose normal.  Mouth/Throat: Uvula is midline. No trismus in the jaw. No uvula swelling. Posterior oropharyngeal erythema (mild) present. No posterior oropharyngeal edema. No tonsillar exudate.  Eyes: Conjunctivae are normal.  Neck: Normal range of motion. Neck supple.  Cardiovascular: Normal rate, regular rhythm, normal heart sounds and intact distal pulses.  Exam reveals no friction rub.   No murmur heard. Pulmonary/Chest: Effort normal and breath sounds normal. No respiratory distress. She has no wheezes. She has no rales.  Abdominal: Soft. Bowel sounds are normal. She exhibits no distension and no mass. There is no tenderness. There is no rebound and no guarding. No hernia.  Musculoskeletal: Normal range of motion.  Lymphadenopathy:    She has no cervical adenopathy.  Neurological: She is alert.  Mental Status:  Alert, oriented, thought content appropriate, able to give a coherent history. Speech fluent without evidence of aphasia. Able to follow 2 step commands without difficulty.  Cranial Nerves:  II:  Peripheral visual fields grossly normal, pupils equal, round, reactive to light III,IV, VI: ptosis not present, extra-ocular motions intact bilaterally V,VII: smile symmetric, facial light touch sensation equal VIII: hearing grossly normal to voice  X: uvula elevates symmetrically  XI: bilateral shoulder shrug symmetric and strong XII: midline tongue extension without fassiculations Motor:  Normal tone. 5/5 in upper and lower extremities bilaterally including strong and equal grip strength and dorsiflexion/plantar flexion Sensory: Pinprick and light touch normal in all extremities.  Deep Tendon Reflexes: 2+ and symmetric in the biceps and patella Cerebellar: normal finger-to-nose with bilateral upper extremities Gait: normal gait and balance CV: distal pulses palpable throughout    Skin: Skin is warm. No rash noted.  Psychiatric: She has a normal mood and affect. Her  behavior is normal.  Nursing note and vitals reviewed.    ED Treatments / Results  Labs (all labs ordered are listed, but only abnormal results are displayed) Labs Reviewed  BASIC METABOLIC PANEL - Abnormal; Notable for the following:       Result Value   Glucose, Bld 102 (*)    All other components within normal limits  URINALYSIS, ROUTINE W REFLEX MICROSCOPIC - Abnormal; Notable for the following:    Color, Urine STRAW (*)    All other components within normal limits  CBC    EKG  EKG Interpretation  Date/Time:  Wednesday March 31 2017 04:36:32 EDT Ventricular Rate:  72 PR Interval:    QRS Duration: 96 QT Interval:  379 QTC Calculation: 415 R Axis:   63 Text Interpretation:  Sinus rhythm RSR' in V1 or V2, right VCD or RVH Confirmed by Randal Buba, April ([id number]) on 03/31/2017 4:39:36 AM Also confirmed by Randal Buba, April ([id number]), editor Laurena Spies [phone])  on 03/31/2017 7:13:53 AM       Radiology No results found.  Procedures Procedures (including critical care time)  Medications Ordered in ED Medications  sodium chloride 0.9 % bolus 1,000 mL (0 mLs Intravenous Stopped 03/31/17 0756)     Initial Impression / Assessment and Plan / ED Course  I have reviewed the triage vital signs and the nursing  notes.  Pertinent labs & imaging results that were available during my care of the patient were reviewed by me and considered in my medical decision making (see chart for details).     Pt presenting with intermittent symptoms of generalized weakness, dull mild headache, nausea. In ED, pt denies HA or nausea. No rash, no recent tick bites, no chest pain, palpitations or SOB. Normal ENT exam, normal cardiac and lung exam, normal neurologic exam. No nuchal rigidity. Pt is afebrile, non-toxic. CBC normal. BMP normal. U/A neg.  Fluids given w some improvement. Pt is well-appearing, not in distress, safe for disharge w PCP follow up.  Patient discussed with Dr.  Randal Buba.  Discussed results, findings, treatment and follow up. Patient advised of return precautions. Patient verbalized understanding and agreed with plan.   Final Clinical Impressions(s) / ED Diagnoses   Final diagnoses:  Generalized weakness    New Prescriptions Discharge Medication List as of 03/31/2017  7:39 AM       Russo, [place] N, PA-C 03/31/17 [id number]    Palumbo, April, MD 03/31/17 2348

## 2018-04-01 DIAGNOSIS — Z23 Encounter for immunization: Secondary | ICD-10-CM | POA: Diagnosis not present

## 2018-06-09 DIAGNOSIS — J4520 Mild intermittent asthma, uncomplicated: Secondary | ICD-10-CM | POA: Diagnosis not present

## 2018-06-09 DIAGNOSIS — J3081 Allergic rhinitis due to animal (cat) (dog) hair and dander: Secondary | ICD-10-CM | POA: Diagnosis not present

## 2018-06-09 DIAGNOSIS — J3089 Other allergic rhinitis: Secondary | ICD-10-CM | POA: Diagnosis not present

## 2018-06-09 DIAGNOSIS — J301 Allergic rhinitis due to pollen: Secondary | ICD-10-CM | POA: Diagnosis not present

## 2018-07-07 ENCOUNTER — Encounter: Payer: Self-pay | Admitting: Nurse Practitioner

## 2018-07-07 ENCOUNTER — Ambulatory Visit: Payer: 59 | Admitting: Nurse Practitioner

## 2018-07-07 VITALS — BP 120/78 | HR 72 | Temp 98.1°F | Ht 61.6 in | Wt 140.2 lb

## 2018-07-07 DIAGNOSIS — I10 Essential (primary) hypertension: Secondary | ICD-10-CM

## 2018-07-07 DIAGNOSIS — R42 Dizziness and giddiness: Secondary | ICD-10-CM | POA: Diagnosis not present

## 2018-07-07 DIAGNOSIS — Z87898 Personal history of other specified conditions: Secondary | ICD-10-CM

## 2018-07-07 DIAGNOSIS — Z09 Encounter for follow-up examination after completed treatment for conditions other than malignant neoplasm: Secondary | ICD-10-CM | POA: Diagnosis not present

## 2018-07-07 DIAGNOSIS — E559 Vitamin D deficiency, unspecified: Secondary | ICD-10-CM | POA: Diagnosis not present

## 2018-07-07 NOTE — Progress Notes (Signed)
  Subjective:     Patient ID: Rachel Reid , female    DOB: Aug 07, 1975 , 43 y.o.   MRN: [id number]   Chief Complaint  Patient presents with  . Hypertension    HPI  Hypertension  This is a recurrent problem. The current episode started more than 1 month ago. The problem has been resolved since onset. The problem is controlled. Pertinent negatives include no chest pain, headaches or palpitations.  Dizziness  This is a new problem. The current episode started more than 1 month ago. The problem occurs intermittently. The problem has been rapidly improving. Pertinent negatives include no chest pain, chills, congestion, coughing, fatigue or headaches. Nothing aggravates the symptoms. She has tried nothing for the symptoms.     Past Medical History:  Diagnosis Date  . Anemia    hx  . Endometriosis   . Environmental allergies   . Seasonal allergies   . SVD (spontaneous vaginal delivery)    x 1     History reviewed. No pertinent family history.   Current Outpatient Medications:  .  cholecalciferol (VITAMIN D) 1000 UNITS tablet, Take 1,000 Units by mouth daily., Disp: , Rfl:  .  Multiple Vitamin (MULTIVITAMIN WITH MINERALS) TABS tablet, Take 1 tablet by mouth daily., Disp: , Rfl:    Allergies  Allergen Reactions  . Sulfa Drugs Cross Reactors Hives     Review of Systems  Constitutional: Negative for chills and fatigue.  HENT: Negative for congestion.   Respiratory: Negative for cough.   Cardiovascular: Negative for chest pain, palpitations and leg swelling.  Neurological: Positive for dizziness. Negative for headaches.     Today's Vitals   07/07/18 1550  BP: 120/78  Pulse: 72  Temp: 98.1 F (36.7 C)  TempSrc: Oral  SpO2: 97%  Weight: 140 lb 3.2 oz (63.6 kg)  Height: 5' 1.6" (1.565 m)  PainSc: 0-No pain   Body mass index is 25.98 kg/m.   Objective:  Physical Exam Vitals signs reviewed.  Constitutional:      Appearance: She is well-developed.  HENT:     Head:  Normocephalic and atraumatic.  Eyes:     Pupils: Pupils are equal, round, and reactive to light.  Cardiovascular:     Rate and Rhythm: Normal rate and regular rhythm.     Pulses: Normal pulses.     Heart sounds: Normal heart sounds. No murmur.  Pulmonary:     Effort: Pulmonary effort is normal.     Breath sounds: Normal breath sounds.  Musculoskeletal:        General: Tenderness: cervical and low back.  Skin:    General: Skin is warm and dry.     Capillary Refill: Capillary refill takes less than 2 seconds.  Neurological:     General: No focal deficit present.     Mental Status: She is alert and oriented to person, place, and time.     Cranial Nerves: No cranial nerve deficit.       Assessment And Plan:     1. Vitamin D deficiency Will check vitamin D level and supplement as needed.    Also encouraged to spend 15 minutes in the sun daily.  - Vitamin D (25 hydroxy)  2. Elevated blood pressure reading in office with diagnosis of hypertension  Resolved   3. Dizziness  Intermittent but feels related to clenching her teeth  Will continue to monitor before any additional testing.      Minette Brine, FNP

## 2018-07-07 NOTE — Patient Instructions (Signed)
Vitamin D Deficiency  Vitamin D deficiency is when your body does not have enough vitamin D. Vitamin D is important because:   It helps your body use other minerals that your body needs.   It helps keep your bones strong and healthy.   It may help to prevent some diseases.   It helps your heart and other muscles work well.  You can get vitamin D by:   Eating foods with vitamin D in them.   Drinking or eating milk or other foods that have had vitamin D added to them.   Taking a vitamin D supplement.   Being in the sun.  Not getting enough vitamin D can make your bones become soft. It can also cause other health problems.  Follow these instructions at home:   Take medicines and supplements only as told by your doctor.   Eat foods that have vitamin D. These include:  ? Dairy products, cereals, or juices with added vitamin D. Check the label for vitamin D.  ? Fatty fish like salmon or trout.  ? Eggs.  ? Oysters.   Do not use tanning beds.   Stay at a healthy weight. Lose weight, if needed.   Keep all follow-up visits as told by your doctor. This is important.  Contact a doctor if:   Your symptoms do not go away.   You feel sick to your stomach (nauseous).   Youthrow up (vomit).   You poop less often than usual or you have trouble pooping (constipation).  This information is not intended to replace advice given to you by your health care provider. Make sure you discuss any questions you have with your health care provider.  Document Released: 05/21/2011 Document Revised: 11/07/2015 Document Reviewed: 10/17/2014  Elsevier Interactive Patient Education  2019 [employer]

## 2018-07-08 LAB — VITAMIN D 25 HYDROXY (VIT D DEFICIENCY, FRACTURES): Vit D, 25-Hydroxy: 15.5 ng/mL — ABNORMAL LOW (ref 30.0–100.0)

## 2018-07-20 DIAGNOSIS — Z01419 Encounter for gynecological examination (general) (routine) without abnormal findings: Secondary | ICD-10-CM | POA: Diagnosis not present

## 2018-07-20 DIAGNOSIS — Z6825 Body mass index (BMI) 25.0-25.9, adult: Secondary | ICD-10-CM | POA: Diagnosis not present

## 2018-07-20 DIAGNOSIS — Z1231 Encounter for screening mammogram for malignant neoplasm of breast: Secondary | ICD-10-CM | POA: Diagnosis not present

## 2018-07-20 LAB — HM MAMMOGRAPHY: HM Mammogram: NORMAL (ref 0–4)

## 2018-07-29 ENCOUNTER — Other Ambulatory Visit: Payer: Self-pay | Admitting: Nurse Practitioner

## 2018-07-29 DIAGNOSIS — E559 Vitamin D deficiency, unspecified: Secondary | ICD-10-CM

## 2018-07-29 MED ORDER — VITAMIN D (ERGOCALCIFEROL) 1.25 MG (50000 UNIT) PO CAPS
50000.0000 [IU] | ORAL_CAPSULE | ORAL | 1 refills | Status: DC
Start: 2018-08-01 — End: 2019-06-12

## 2018-08-01 DIAGNOSIS — Z13228 Encounter for screening for other metabolic disorders: Secondary | ICD-10-CM | POA: Diagnosis not present

## 2018-08-01 DIAGNOSIS — Z1329 Encounter for screening for other suspected endocrine disorder: Secondary | ICD-10-CM | POA: Diagnosis not present

## 2018-08-01 DIAGNOSIS — Z13220 Encounter for screening for lipoid disorders: Secondary | ICD-10-CM | POA: Diagnosis not present

## 2019-01-05 ENCOUNTER — Encounter: Payer: Self-pay | Admitting: Nurse Practitioner

## 2019-01-05 ENCOUNTER — Ambulatory Visit: Payer: 59 | Admitting: Nurse Practitioner

## 2019-01-05 ENCOUNTER — Other Ambulatory Visit: Payer: Self-pay

## 2019-01-05 VITALS — BP 126/70 | HR 76 | Temp 98.3°F | Ht 61.8 in | Wt 143.6 lb

## 2019-01-05 DIAGNOSIS — Z23 Encounter for immunization: Secondary | ICD-10-CM

## 2019-01-05 DIAGNOSIS — Z0000 Encounter for general adult medical examination without abnormal findings: Secondary | ICD-10-CM | POA: Diagnosis not present

## 2019-01-05 DIAGNOSIS — Z113 Encounter for screening for infections with a predominantly sexual mode of transmission: Secondary | ICD-10-CM

## 2019-01-05 DIAGNOSIS — H6121 Impacted cerumen, right ear: Secondary | ICD-10-CM

## 2019-01-05 DIAGNOSIS — E559 Vitamin D deficiency, unspecified: Secondary | ICD-10-CM | POA: Diagnosis not present

## 2019-01-05 LAB — POCT URINALYSIS DIPSTICK
Bilirubin, UA: NEGATIVE
Blood, UA: NEGATIVE
Glucose, UA: NEGATIVE
Ketones, UA: NEGATIVE
Leukocytes, UA: NEGATIVE
Nitrite, UA: NEGATIVE
Protein, UA: NEGATIVE
Spec Grav, UA: 1.025 (ref 1.010–1.025)
Urobilinogen, UA: 0.2 E.U./dL
pH, UA: 6.5 (ref 5.0–8.0)

## 2019-01-05 MED ORDER — TETANUS-DIPHTH-ACELL PERTUSSIS 5-2.5-18.5 LF-MCG/0.5 IM SUSP
0.5000 mL | Freq: Once | INTRAMUSCULAR | Status: AC
  Administered 2019-01-05: 0.5 mL via INTRAMUSCULAR

## 2019-01-05 NOTE — Progress Notes (Signed)
Subjective:     Patient ID: Rachel Reid , female    DOB: 04-Aug-1975 , 43 y.o.   MRN: [id number]   Chief Complaint  Patient presents with  . Annual Exam   The patient states she uses Ablation for birth control. Last LMP was Patient's last menstrual period was 01/05/2014.. Negative for Dysmenorrhea and Negative for Menorrhagia Mammogram last done 2/20  Negative for: breast discharge, breast lump(s), breast pain and breast self exam.  Pertinent negatives include abnormal bleeding (hematology), anxiety, decreased libido, depression, difficulty falling sleep, dyspareunia, history of infertility, nocturia, sexual dysfunction, sleep disturbances, urinary incontinence, urinary urgency, vaginal discharge and vaginal itching. Diet regular The patient states her exercise level is  intermittent 3-4 days.       The patient's tobacco use is:  Social History   Tobacco Use  Smoking Status Never Smoker  Smokeless Tobacco Never Used   She has been exposed to passive smoke. The patient's alcohol use is:  Social History   Substance and Sexual Activity  Alcohol Use No   Additional information: Last pap 07/2018 - normal with Dr. Radene Knee, also had mammogram.   Next one scheduled for 2021.   HPI  Here for HM.     Past Medical History:  Diagnosis Date  . Anemia    hx  . Endometriosis   . Environmental allergies   . Seasonal allergies   . SVD (spontaneous vaginal delivery)    x 1     No family history on file.   Current Outpatient Medications:  Marland Kitchen  Multiple Vitamin (MULTIVITAMIN WITH MINERALS) TABS tablet, Take 1 tablet by mouth daily., Disp: , Rfl:  .  Vitamin D, Ergocalciferol, (DRISDOL) 1.25 MG (50000 UT) CAPS capsule, Take 1 capsule (50,000 Units total) by mouth 2 (two) times a week., Disp: 24 capsule, Rfl: 1   Allergies  Allergen Reactions  . Sulfa Drugs Cross Reactors Hives     Review of Systems  Constitutional: Negative.  Negative for fatigue.  HENT: Negative.   Eyes: Negative.    Respiratory: Negative.   Cardiovascular: Negative.  Negative for chest pain, palpitations and leg swelling.  Gastrointestinal: Negative.   Endocrine: Negative.  Negative for polydipsia, polyphagia and polyuria.  Genitourinary: Negative.   Musculoskeletal: Negative.   Skin: Negative.   Allergic/Immunologic: Negative.   Neurological: Negative.   Hematological: Negative.   Psychiatric/Behavioral: Negative.      Today's Vitals   01/05/19 0924  BP: 126/70  Pulse: 76  Temp: 98.3 F (36.8 C)  TempSrc: Oral  SpO2: 98%  Weight: 143 lb 9.6 oz (65.1 kg)  Height: 5' 1.8" (1.57 m)   Body mass index is 26.44 kg/m.   Objective:  Physical Exam Constitutional:      Appearance: Normal appearance. She is well-developed.  HENT:     Head: Normocephalic and atraumatic.     Right Ear: Hearing and external ear normal. There is impacted cerumen (unable to visualize TM due to increased cerumen).     Left Ear: Hearing, tympanic membrane, ear canal and external ear normal.     Nose: Nose normal.     Mouth/Throat:     Mouth: Mucous membranes are moist.  Eyes:     General: Lids are normal.     Conjunctiva/sclera: Conjunctivae normal.     Pupils: Pupils are equal, round, and reactive to light.     Funduscopic exam:    Right eye: No papilledema.        Left eye: No papilledema.  Neck:     Musculoskeletal: Full passive range of motion without pain, normal range of motion and neck supple.     Thyroid: No thyroid mass.     Vascular: No carotid bruit.  Cardiovascular:     Rate and Rhythm: Normal rate and regular rhythm.     Pulses: Normal pulses.     Heart sounds: Normal heart sounds. No murmur.  Pulmonary:     Effort: Pulmonary effort is normal.     Breath sounds: Normal breath sounds.  Abdominal:     General: Abdomen is flat. Bowel sounds are normal.     Palpations: Abdomen is soft.  Musculoskeletal: Normal range of motion.        General: No swelling.     Right lower leg: No edema.      Left lower leg: No edema.  Skin:    General: Skin is warm and dry.     Capillary Refill: Capillary refill takes less than 2 seconds.  Neurological:     General: No focal deficit present.     Mental Status: She is alert and oriented to person, place, and time.     Cranial Nerves: No cranial nerve deficit.     Sensory: No sensory deficit.  Psychiatric:        Mood and Affect: Mood normal.        Behavior: Behavior normal.        Thought Content: Thought content normal.        Judgment: Judgment normal.         Assessment And Plan:     1. Health maintenance examination . Behavior modifications discussed and diet history reviewed.   . Pt will continue to exercise regularly and modify diet with low GI, plant based foods and decrease intake of processed foods.  . Recommend intake of daily multivitamin, Vitamin D, and calcium.  . Recommend mammogram (done with Dr. Radene Knee) for preventive screenings, as well as recommend immunizations that include  TDAP - CBC no Diff - CMP14 + Anion Gap - Hemoglobin A1c - Lipid Profile  2. Vitamin D deficiency  Will check vitamin D level and supplement as needed.     Also encouraged to spend 15 minutes in the sun daily.  - Vitamin D (25 hydroxy)  3. Screening examination for sexually transmitted disease  - HIV antibody (with reflex)  4. Encounter for immunization  Will give tetanus vaccine today while in office. Refer to order management. TDAP will be administered to adults 32-93 years old every 10 years. - Tdap (BOOSTRIX) injection 0.5 mL  5. Impacted cerumen of right ear  Water lavage - Ear Lavage    Minette Brine, FNP    THE PATIENT IS ENCOURAGED TO PRACTICE SOCIAL DISTANCING DUE TO THE COVID-19 PANDEMIC.

## 2019-01-05 NOTE — Patient Instructions (Signed)
Health Maintenance, Female Adopting a healthy lifestyle and getting preventive care are important in promoting health and wellness. Ask your health care provider about:  The right schedule for you to have regular tests and exams.  Things you can do on your own to prevent diseases and keep yourself healthy. What should I know about diet, weight, and exercise? Eat a healthy diet   Eat a diet that includes plenty of vegetables, fruits, low-fat dairy products, and lean protein.  Do not eat a lot of foods that are high in solid fats, added sugars, or sodium. Maintain a healthy weight Body mass index (BMI) is used to identify weight problems. It estimates body fat based on height and weight. Your health care provider can help determine your BMI and help you achieve or maintain a healthy weight. Get regular exercise Get regular exercise. This is one of the most important things you can do for your health. Most adults should:  Exercise for at least 150 minutes each week. The exercise should increase your heart rate and make you sweat (moderate-intensity exercise).  Do strengthening exercises at least twice a week. This is in addition to the moderate-intensity exercise.  Spend less time sitting. Even light physical activity can be beneficial. Watch cholesterol and blood lipids Have your blood tested for lipids and cholesterol at 42 years of age, then have this test every 5 years. Have your cholesterol levels checked more often if:  Your lipid or cholesterol levels are high.  You are older than 42 years of age.  You are at high risk for heart disease. What should I know about cancer screening? Depending on your health history and family history, you may need to have cancer screening at various ages. This may include screening for:  Breast cancer.  Cervical cancer.  Colorectal cancer.  Skin cancer.  Lung cancer. What should I know about heart disease, diabetes, and high blood  pressure? Blood pressure and heart disease  High blood pressure causes heart disease and increases the risk of stroke. This is more likely to develop in people who have high blood pressure readings, are of African descent, or are overweight.  Have your blood pressure checked: ? Every 3-5 years if you are 18-39 years of age. ? Every year if you are 40 years old or older. Diabetes Have regular diabetes screenings. This checks your fasting blood sugar level. Have the screening done:  Once every three years after age 40 if you are at a normal weight and have a low risk for diabetes.  More often and at a younger age if you are overweight or have a high risk for diabetes. What should I know about preventing infection? Hepatitis B If you have a higher risk for hepatitis B, you should be screened for this virus. Talk with your health care provider to find out if you are at risk for hepatitis B infection. Hepatitis C Testing is recommended for:  Everyone born from 1945 through 1965.  Anyone with known risk factors for hepatitis C. Sexually transmitted infections (STIs)  Get screened for STIs, including gonorrhea and chlamydia, if: ? You are sexually active and are younger than 42 years of age. ? You are older than 42 years of age and your health care provider tells you that you are at risk for this type of infection. ? Your sexual activity has changed since you were last screened, and you are at increased risk for chlamydia or gonorrhea. Ask your health care provider if   you are at risk.  Ask your health care provider about whether you are at high risk for HIV. Your health care provider may recommend a prescription medicine to help prevent HIV infection. If you choose to take medicine to prevent HIV, you should first get tested for HIV. You should then be tested every 3 months for as long as you are taking the medicine. Pregnancy  If you are about to stop having your period (premenopausal) and  you may become pregnant, seek counseling before you get pregnant.  Take 400 to 800 micrograms (mcg) of folic acid every day if you become pregnant.  Ask for birth control (contraception) if you want to prevent pregnancy. Osteoporosis and menopause Osteoporosis is a disease in which the bones lose minerals and strength with aging. This can result in bone fractures. If you are 17 years old or older, or if you are at risk for osteoporosis and fractures, ask your health care provider if you should:  Be screened for bone loss.  Take a calcium or vitamin D supplement to lower your risk of fractures.  Be given hormone replacement therapy (HRT) to treat symptoms of menopause. Follow these instructions at home: Lifestyle  Do not use any products that contain nicotine or tobacco, such as cigarettes, e-cigarettes, and chewing tobacco. If you need help quitting, ask your health care provider.  Do not use street drugs.  Do not share needles.  Ask your health care provider for help if you need support or information about quitting drugs. Alcohol use  Do not drink alcohol if: ? Your health care provider tells you not to drink. ? You are pregnant, may be pregnant, or are planning to become pregnant.  If you drink alcohol: ? Limit how much you use to 0-1 drink a day. ? Limit intake if you are breastfeeding.  Be aware of how much alcohol is in your drink. In the [place] one drink equals one 12 oz bottle of beer (355 mL), one 5 oz glass of wine (148 mL), or one 1 oz glass of hard liquor (44 mL). General instructions  Schedule regular health, dental, and eye exams.  Stay current with your vaccines.  Tell your health care provider if: ? You often feel depressed. ? You have ever been abused or do not feel safe at home. Summary  Adopting a healthy lifestyle and getting preventive care are important in promoting health and wellness.  Follow your health care provider's instructions about healthy  diet, exercising, and getting tested or screened for diseases.  Follow your health care provider's instructions on monitoring your cholesterol and blood pressure. This information is not intended to replace advice given to you by your health care provider. Make sure you discuss any questions you have with your health care provider. Document Released: 12/15/2010 Document Revised: 05/25/2018 Document Reviewed: 05/25/2018 Elsevier Patient Education  2020 [place] Maintenance  Topic Date Due  . HIV Screening  04/19/1992  . PAP SMEAR-Modifier  04/24/2017  . INFLUENZA VACCINE  01/14/2019  . TETANUS/TDAP  01/04/2029

## 2019-01-06 LAB — LIPID PANEL
Chol/HDL Ratio: 2.9 ratio (ref 0.0–4.4)
Cholesterol, Total: 158 mg/dL (ref 100–199)
HDL: 55 mg/dL (ref >39)
LDL Calculated: 90 mg/dL (ref 0–99)
Triglycerides: 67 mg/dL (ref 0–149)
VLDL Cholesterol Cal: 13 mg/dL (ref 5–40)

## 2019-01-06 LAB — CBC
Hematocrit: 39.7 % (ref 34.0–46.6)
Hemoglobin: 13.2 g/dL (ref 11.1–15.9)
MCH: 29.2 pg (ref 26.6–33.0)
MCHC: 33.2 g/dL (ref 31.5–35.7)
MCV: 88 fL (ref 79–97)
Platelets: 206 10*3/uL (ref 150–450)
RBC: 4.52 x10E6/uL (ref 3.77–5.28)
RDW: 13 % (ref 11.7–15.4)
WBC: 3.7 10*3/uL (ref 3.4–10.8)

## 2019-01-06 LAB — CMP14 + ANION GAP
ALT: 14 IU/L (ref 0–32)
AST: 29 IU/L (ref 0–40)
Albumin/Globulin Ratio: 1.8 (ref 1.2–2.2)
Albumin: 4.5 g/dL (ref 3.8–4.8)
Alkaline Phosphatase: 56 IU/L (ref 39–117)
Anion Gap: 12 mmol/L (ref 10.0–18.0)
BUN/Creatinine Ratio: 11 (ref 9–23)
BUN: 9 mg/dL (ref 6–24)
Bilirubin Total: 0.6 mg/dL (ref 0.0–1.2)
CO2: 20 mmol/L (ref 20–29)
Calcium: 8.9 mg/dL (ref 8.7–10.2)
Chloride: 103 mmol/L (ref 96–106)
Creatinine, Ser: 0.82 mg/dL (ref 0.57–1.00)
GFR calc Af Amer: 103 mL/min/{1.73_m2} (ref >59)
GFR calc non Af Amer: 89 mL/min/{1.73_m2} (ref >59)
Globulin, Total: 2.5 g/dL (ref 1.5–4.5)
Glucose: 85 mg/dL (ref 65–99)
Potassium: 4.2 mmol/L (ref 3.5–5.2)
Sodium: 135 mmol/L (ref 134–144)
Total Protein: 7 g/dL (ref 6.0–8.5)

## 2019-01-06 LAB — VITAMIN D 25 HYDROXY (VIT D DEFICIENCY, FRACTURES): Vit D, 25-Hydroxy: 33.9 ng/mL (ref 30.0–100.0)

## 2019-01-06 LAB — HIV ANTIBODY (ROUTINE TESTING W REFLEX): HIV Screen 4th Generation wRfx: NONREACTIVE

## 2019-01-06 LAB — HEMOGLOBIN A1C
Est. average glucose Bld gHb Est-mCnc: 91 mg/dL
Hgb A1c MFr Bld: 4.8 % (ref 4.8–5.6)

## 2019-02-15 ENCOUNTER — Encounter: Payer: Self-pay | Admitting: Nurse Practitioner

## 2019-06-09 ENCOUNTER — Other Ambulatory Visit: Payer: Self-pay | Admitting: Nurse Practitioner

## 2019-06-09 DIAGNOSIS — E559 Vitamin D deficiency, unspecified: Secondary | ICD-10-CM

## 2019-08-11 ENCOUNTER — Ambulatory Visit: Payer: 59 | Attending: Internal Medicine

## 2019-08-11 DIAGNOSIS — Z23 Encounter for immunization: Secondary | ICD-10-CM

## 2019-08-11 NOTE — Progress Notes (Signed)
   Covid-19 Vaccination Clinic  Name:  Rachel Reid    MRN: [id number] DOB: Nov 14, 1975  08/11/2019  [name] was observed post Covid-19 immunization for 15 minutes without incidence. She was provided with Vaccine Information Sheet and instruction to access the V-Safe system.   [name] was instructed to call 911 with any severe reactions post vaccine: Marland Kitchen Difficulty breathing  . Swelling of your face and throat  . A fast heartbeat  . A bad rash all over your body  . Dizziness and weakness    Immunizations Administered    Name Date Dose VIS Date Route   Pfizer COVID-19 Vaccine 08/11/2019  9:14 AM 0.3 mL 05/26/2019 Intramuscular   Manufacturer: [place]   Lot: [id number]   [place]: [id number]

## 2019-09-05 ENCOUNTER — Ambulatory Visit: Payer: 59 | Attending: Internal Medicine

## 2019-09-05 DIAGNOSIS — Z23 Encounter for immunization: Secondary | ICD-10-CM

## 2019-09-05 NOTE — Progress Notes (Signed)
   Covid-19 Vaccination Clinic  Name:  Rachel Reid    MRN: [id number] DOB: 05-24-1976  09/05/2019  [name] was observed post Covid-19 immunization for 15 minutes without incident. She was provided with Vaccine Information Sheet and instruction to access the V-Safe system.   [name] was instructed to call 911 with any severe reactions post vaccine: Marland Kitchen Difficulty breathing  . Swelling of face and throat  . A fast heartbeat  . A bad rash all over body  . Dizziness and weakness   Immunizations Administered    Name Date Dose VIS Date Route   Pfizer COVID-19 Vaccine 09/05/2019  2:18 PM 0.3 mL 05/26/2019 Intramuscular   Manufacturer: [place]   Lot: [id number]   [place]: [id number]

## 2019-11-28 ENCOUNTER — Emergency Department
Admission: EM | Admit: 2019-11-28 | Discharge: 2019-11-28 | Disposition: A | Discharge status: Home / Self Care | Payer: 59 | Attending: Emergency Medicine | Admitting: Emergency Medicine

## 2019-11-28 ENCOUNTER — Emergency Department: Payer: 59

## 2019-11-28 ENCOUNTER — Other Ambulatory Visit: Payer: Self-pay

## 2019-11-28 DIAGNOSIS — Z79899 Other long term (current) drug therapy: Secondary | ICD-10-CM | POA: Diagnosis not present

## 2019-11-28 DIAGNOSIS — R0789 Other chest pain: Secondary | ICD-10-CM | POA: Diagnosis present

## 2019-11-28 DIAGNOSIS — R202 Paresthesia of skin: Secondary | ICD-10-CM | POA: Diagnosis not present

## 2019-11-28 DIAGNOSIS — R079 Chest pain, unspecified: Secondary | ICD-10-CM

## 2019-11-28 LAB — BASIC METABOLIC PANEL
Anion gap: 10 (ref 5–15)
BUN: 13 mg/dL (ref 6–20)
CO2: 22 mmol/L (ref 22–32)
Calcium: 9 mg/dL (ref 8.9–10.3)
Chloride: 105 mmol/L (ref 98–111)
Creatinine, Ser: 0.82 mg/dL (ref 0.44–1.00)
GFR calc Af Amer: >60 mL/min (ref >60)
GFR calc non Af Amer: >60 mL/min (ref >60)
Glucose, Bld: 104 mg/dL — ABNORMAL HIGH (ref 70–99)
Potassium: 4.6 mmol/L (ref 3.5–5.1)
Sodium: 137 mmol/L (ref 135–145)

## 2019-11-28 LAB — CBC
HCT: 37.4 % (ref 36.0–46.0)
Hemoglobin: 12.9 g/dL (ref 12.0–15.0)
MCH: 29.9 pg (ref 26.0–34.0)
MCHC: 34.5 g/dL (ref 30.0–36.0)
MCV: 86.8 fL (ref 80.0–100.0)
Platelets: 207 10*3/uL (ref 150–400)
RBC: 4.31 MIL/uL (ref 3.87–5.11)
RDW: 13.1 % (ref 11.5–15.5)
WBC: 4.2 10*3/uL (ref 4.0–10.5)
nRBC: 0 % (ref 0.0–0.2)

## 2019-11-28 LAB — TROPONIN I (HIGH SENSITIVITY)
Troponin I (High Sensitivity): <2 ng/L (ref <18)
Troponin I (High Sensitivity): <2 ng/L (ref <18)

## 2019-11-28 NOTE — ED Triage Notes (Signed)
Pt arrives to ED via POV from home with c/o centralized chest pain with radiation into the left arm x1 week. Pt reports s/x's have been "off and on for the last week"; pt denies change in s/x's, simply states she is "tired of dealing with it". Pt reports (+) nausea, but denies emesis, diarrhea or fever. Pt denies previous cardiac h/x. Pt is A&O, in NAD; RR even, regular, and unlabored.

## 2019-11-28 NOTE — Discharge Instructions (Addendum)
Please seek medical attention for any high fevers, chest pain, shortness of breath, change in behavior, persistent vomiting, bloody stool or any other new or concerning symptoms.  

## 2019-11-28 NOTE — ED Provider Notes (Signed)
[facility] Emergency Department Provider Note   ____________________________________________   I have reviewed the triage vital signs and the nursing notes.   HISTORY  Chief Complaint Chest Pain   History limited by: Not Limited   HPI Rachel Reid is a 44 y.o. female who presents to the emergency department today because of concerns for chest pain, left arm change in sensation, back pain and lightheadedness.  The patient states the symptoms have been present for the past few weeks.  They are intermittent.  She has not noticed that they are worse with exertion.  They might be slightly worse when she lies flat.  Thinks that the first symptom she had was the lightheadedness.  She describes the chest pain as feeling like a tightness across the center part of her chest.  She has had some nausea but no vomiting.  No change in stool.  She has had occasional cough.  Patient has an appointment to see her primary care although symptoms were slightly worse last night so she decided to present to the emergency department.   Records reviewed. Per medical record review patient has a history of anemia  Past Medical History:  Diagnosis Date  . Anemia    hx  . Endometriosis   . Environmental allergies   . Seasonal allergies   . SVD (spontaneous vaginal delivery)    x 1    Patient Active Problem List   Diagnosis Date Noted  . Vitamin D deficiency 07/07/2018  . Endometriosis 01/15/2014    Past Surgical History:  Procedure Laterality Date  . BILATERAL SALPINGECTOMY Bilateral 01/15/2014   Procedure: BILATERAL SALPINGECTOMY;  Surgeon: Darlyn Chamber, MD;  Location: [facility];  Service: Gynecology;  Laterality: Bilateral;  . CESAREAN SECTION     x 1  . LAPAROSCOPIC ASSISTED VAGINAL HYSTERECTOMY N/A 01/15/2014   Procedure: LAPAROSCOPIC ASSISTED VAGINAL HYSTERECTOMY;  Surgeon: Darlyn Chamber, MD;  Location: [facility];  Service: Gynecology;  Laterality: N/A;  . TUBAL LIGATION      lap tubal  . WISDOM TOOTH EXTRACTION      Prior to Admission medications   Medication Sig Start Date End Date Taking? Authorizing Provider  Multiple Vitamin (MULTIVITAMIN WITH MINERALS) TABS tablet Take 1 tablet by mouth daily.    [provider]  Vitamin D, Ergocalciferol, (DRISDOL) 1.25 MG (50000 UT) CAPS capsule TAKE 1 CAPSULE (50,000 UNITS TOTAL) BY MOUTH 2 (TWO) TIMES A WEEK. 06/12/19   Minette Brine, FNP    Allergies Sulfa drugs cross reactors  No family history on file.  Social History Social History   Tobacco Use  . Smoking status: Never Smoker  . Smokeless tobacco: Never Used  Substance Use Topics  . Alcohol use: No  . Drug use: No    Review of Systems Constitutional: No fever/chills Eyes: No visual changes. ENT: No sore throat. Cardiovascular: Positive for chest pain. Respiratory: Denies shortness of breath. Positive for occasional cough. Gastrointestinal: No abdominal pain.  Positive for nausea, no vomiting. No diarrhea. Genitourinary: Negative for dysuria. Musculoskeletal: Positive for back pain, positive for left arm tingling Skin: Negative for rash. Neurological: Positive for lightheadedness. ____________________________________________   PHYSICAL EXAM:  VITAL SIGNS: ED Triage Vitals  Enc Vitals Group     BP 11/28/19 0212 (!) 158/87     Pulse Rate 11/28/19 0212 89     Resp 11/28/19 0212 16     Temp 11/28/19 0212 98.2 F (36.8 C)     Temp Source 11/28/19 0212 Oral  SpO2 11/28/19 0212 100 %     Weight 11/28/19 0213 130 lb (59 kg)     Height 11/28/19 0213 5\' 1"  (1.549 m)     Head Circumference --      Peak Flow --      Pain Score 11/28/19 0213 5   Constitutional: Alert and oriented.  Eyes: Conjunctivae are normal.  ENT      Head: Normocephalic and atraumatic.      Nose: No congestion/rhinnorhea.      Mouth/Throat: Mucous membranes are moist.      Neck: No stridor. Hematological/Lymphatic/Immunilogical: No cervical  lymphadenopathy. Cardiovascular: Normal rate, regular rhythm.  No murmurs, rubs, or gallops. Respiratory: Normal respiratory effort without tachypnea nor retractions. Breath sounds are clear and equal bilaterally. No wheezes/rales/rhonchi. Gastrointestinal: Soft and non tender. No rebound. No guarding.  Genitourinary: Deferred Musculoskeletal: Normal range of motion in all extremities. No lower extremity edema. Neurologic:  Normal speech and language. No gross focal neurologic deficits are appreciated.  Skin:  Skin is warm, dry and intact. No rash noted. Psychiatric: Mood and affect are normal. Speech and behavior are normal. Patient exhibits appropriate insight and judgment.  ____________________________________________    LABS (pertinent positives/negatives)  Trop hs < 2 x 2 CBC wbc 4.2, hgb 12.9, plt 207 BMP wnl except glu 104  ____________________________________________   EKG  I, Nance Pear, attending physician, personally viewed and interpreted this EKG  EKG Time: 0211 Rate: 91 Rhythm: normal sinus rhythm with sinus arrythmia Axis: normal Intervals: qtc 462 QRS: incomplete rbbb ST changes: no st elevation Impression: abnormal ekg ____________________________________________    RADIOLOGY  CXR No acute cardiopulmonary disease  ____________________________________________   PROCEDURES  Procedures  ____________________________________________   INITIAL IMPRESSION / ASSESSMENT AND PLAN / ED COURSE  Pertinent labs & imaging results that were available during my care of the patient were reviewed by me and considered in my medical decision making (see chart for details).   Patient presented to the emergency department today because of concerns for chest discomfort, left arm change in sensation, back pain and lightheadedness.  Blood work without concerning electrolyte abnormality or anemia.  Troponin was negative x2.  Physical exam without any lower extremity  edema, patient not tachycardic.  She describes a tightness in her chest.  At this time no sharp pain.  I doubt PE.  Additionally doubt dissection.  Somewhat unclear etiology of all of the patient's symptoms.  In terms of the chest discomfort do wonder if it could be GI related given accompanying nausea.  Discussed this with the patient.  She does have close follow-up already scheduled with her primary care physician.  ____________________________________________   FINAL CLINICAL IMPRESSION(S) / ED DIAGNOSES  Final diagnoses:  Nonspecific chest pain  Paresthesia     Note: This dictation was prepared with Dragon dictation. Any transcriptional errors that result from this process are unintentional     Nance Pear, MD 11/28/19 [phone]

## 2019-12-06 ENCOUNTER — Other Ambulatory Visit: Payer: Self-pay

## 2019-12-06 ENCOUNTER — Encounter: Payer: Self-pay | Admitting: Nurse Practitioner

## 2019-12-06 ENCOUNTER — Ambulatory Visit (INDEPENDENT_AMBULATORY_CARE_PROVIDER_SITE_OTHER): Payer: 59 | Admitting: Nurse Practitioner

## 2019-12-06 VITALS — BP 122/78 | HR 77 | Temp 98.1°F | Ht 61.0 in | Wt 141.0 lb

## 2019-12-06 DIAGNOSIS — R002 Palpitations: Secondary | ICD-10-CM | POA: Diagnosis not present

## 2019-12-06 DIAGNOSIS — R5383 Other fatigue: Secondary | ICD-10-CM | POA: Diagnosis not present

## 2019-12-06 DIAGNOSIS — M542 Cervicalgia: Secondary | ICD-10-CM | POA: Diagnosis not present

## 2019-12-06 DIAGNOSIS — R0789 Other chest pain: Secondary | ICD-10-CM

## 2019-12-06 MED ORDER — PREDNISONE 20 MG PO TABS: ORAL_TABLET | ORAL | 0 refills | Status: DC

## 2019-12-06 MED ORDER — CYCLOBENZAPRINE HCL 10 MG PO TABS: 10.0000 mg | ORAL_TABLET | Freq: Three times a day (TID) | ORAL | 0 refills | Status: DC | PRN

## 2019-12-06 MED ORDER — MAGNESIUM 250 MG PO TABS: ORAL_TABLET | ORAL | 2 refills | Status: DC

## 2019-12-06 MED ORDER — OMEPRAZOLE 20 MG PO CPDR: 20.0000 mg | DELAYED_RELEASE_CAPSULE | Freq: Every day | ORAL | 1 refills | Status: DC

## 2019-12-06 NOTE — Progress Notes (Signed)
Subjective:     Patient ID: Rachel Reid , female    DOB: November 24, 1975 , 44 y.o.   MRN: [id number]   Chief Complaint  Patient presents with  . Chest Pain    HPI  She presents today after a ED follow up after having chest pain. She reports that she has sleep interruption and she has also feels lightheaded occasionally through out the day. She has also felt fatigues and has some SOB associated with the chest pain. She reports she is not on new supplements and feels sleep deprived. She does not report any new stress or issues with any depression. She does report clinching her jaws during the day and at night. She has some constipation and some stiffness in her shoulders and neck with numbness down both arms.    She went to urgent care in May 2020 and was given medication for reflux.  She does not remember if she had any improvement at that time.     Past Medical History:  Diagnosis Date  . Anemia    hx  . Endometriosis   . Environmental allergies   . Seasonal allergies   . SVD (spontaneous vaginal delivery)    x 1     History reviewed. No pertinent family history.   Current Outpatient Medications:  Marland Kitchen  Multiple Vitamin (MULTIVITAMIN WITH MINERALS) TABS tablet, Take 1 tablet by mouth daily., Disp: , Rfl:  .  cyclobenzaprine (FLEXERIL) 10 MG tablet, Take 1 tablet (10 mg total) by mouth 3 (three) times daily as needed for muscle spasms., Disp: 30 tablet, Rfl: 0 .  Magnesium 250 MG TABS, Take 1 tablet by mouth with evening meal, Disp: 30 tablet, Rfl: 2 .  omeprazole (PRILOSEC) 20 MG capsule, Take 1 capsule (20 mg total) by mouth daily., Disp: 30 capsule, Rfl: 1 .  predniSONE (DELTASONE) 20 MG tablet, Take 2 tablets by mouth daily x 3 days, Disp: 6 tablet, Rfl: 0 .  Vitamin D, Ergocalciferol, (DRISDOL) 1.25 MG (50000 UNIT) CAPS capsule, Take 1 capsule (50,000 Units total) by mouth 2 (two) times a week., Disp: 24 capsule, Rfl: 1   Allergies  Allergen Reactions  . Sulfa Drugs Cross  Reactors Hives     Review of Systems  Constitutional: Positive for fatigue.  Respiratory: Negative.  Negative for cough.   Cardiovascular: Positive for palpitations. Negative for chest pain and leg swelling.  Neurological: Negative for dizziness and headaches.  Psychiatric/Behavioral: Negative.      Today's Vitals   12/06/19 1411  BP: 122/78  Pulse: 77  Temp: 98.1 F (36.7 C)  TempSrc: Oral  SpO2: 97%  Weight: 141 lb (64 kg)  Height: 5\' 1"  (1.549 m)  PainSc: 0-No pain   Body mass index is 26.64 kg/m.   Objective:  Physical Exam Vitals reviewed.  Constitutional:      General: She is not in acute distress.    Appearance: She is well-developed.  HENT:     Head: Normocephalic and atraumatic.  Eyes:     Pupils: Pupils are equal, round, and reactive to light.  Cardiovascular:     Rate and Rhythm: Normal rate and regular rhythm.     Pulses: Normal pulses.     Heart sounds: Normal heart sounds. No murmur heard.   Pulmonary:     Effort: Pulmonary effort is normal. No respiratory distress.     Breath sounds: Normal breath sounds.  Musculoskeletal:        General: Tenderness: cervical and low back.  Skin:    General: Skin is warm and dry.     Capillary Refill: Capillary refill takes less than 2 seconds.  Neurological:     General: No focal deficit present.     Mental Status: She is alert and oriented to person, place, and time.     Cranial Nerves: No cranial nerve deficit.  Psychiatric:        Mood and Affect: Mood normal.        Thought Content: Thought content normal.        Judgment: Judgment normal.       Assessment And Plan:    1. Chest discomfort  She is going to try an antireflux medication to see if she has improvement however I will refer her to cardiology for further evaluation since this is the second time in a year she has had these similar symptoms - omeprazole (PRILOSEC) 20 MG capsule; Take 1 capsule (20 mg total) by mouth daily.  Dispense: 30  capsule; Refill: 1 - Ambulatory referral to Cardiology  2. Cervical pain (neck)  Tension noted to left lateral neck  Will treat with cyclobenzaprine and prednisone  May use heat pack - cyclobenzaprine (FLEXERIL) 10 MG tablet; Take 1 tablet (10 mg total) by mouth 3 (three) times daily as needed for muscle spasms.  Dispense: 30 tablet; Refill: 0 - predniSONE (DELTASONE) 20 MG tablet; Take 2 tablets by mouth daily x 3 days  Dispense: 6 tablet; Refill: 0  3. Palpitations  Will check metabolic cause  Encouraged to avoid caffeine - Ambulatory referral to Cardiology - TSH - Magnesium 250 MG TABS; Take 1 tablet by mouth with evening meal  Dispense: 30 tablet; Refill: 2  4. Fatigue, unspecified type  Will check metabolic cause  She will be referred to cardiologist as well - TSH - VITAMIN D 25 Hydroxy (Vit-D Deficiency, Fractures) - Vitamin B12 - Magnesium 250 MG TABS; Take 1 tablet by mouth with evening meal  Dispense: 30 tablet; Refill: [place], FNP

## 2019-12-07 ENCOUNTER — Other Ambulatory Visit: Payer: Self-pay | Admitting: Nurse Practitioner

## 2019-12-07 DIAGNOSIS — E559 Vitamin D deficiency, unspecified: Secondary | ICD-10-CM

## 2019-12-07 LAB — TSH: TSH: 1.34 u[IU]/mL (ref 0.450–4.500)

## 2019-12-07 LAB — VITAMIN B12: Vitamin B-12: 712 pg/mL (ref 232–1245)

## 2019-12-07 LAB — VITAMIN D 25 HYDROXY (VIT D DEFICIENCY, FRACTURES): Vit D, 25-Hydroxy: 34.9 ng/mL (ref 30.0–100.0)

## 2019-12-07 MED ORDER — VITAMIN D (ERGOCALCIFEROL) 1.25 MG (50000 UNIT) PO CAPS: 50000.0000 [IU] | ORAL_CAPSULE | ORAL | 1 refills | Status: DC

## 2019-12-28 ENCOUNTER — Other Ambulatory Visit: Payer: Self-pay | Admitting: Nurse Practitioner

## 2019-12-28 DIAGNOSIS — R0789 Other chest pain: Secondary | ICD-10-CM

## 2020-01-10 ENCOUNTER — Ambulatory Visit (INDEPENDENT_AMBULATORY_CARE_PROVIDER_SITE_OTHER): Payer: 59 | Admitting: Nurse Practitioner

## 2020-01-10 ENCOUNTER — Encounter: Payer: Self-pay | Admitting: Nurse Practitioner

## 2020-01-10 ENCOUNTER — Other Ambulatory Visit: Payer: Self-pay

## 2020-01-10 VITALS — BP 118/68 | HR 83 | Temp 98.1°F | Ht 61.0 in | Wt 139.8 lb

## 2020-01-10 DIAGNOSIS — Z1159 Encounter for screening for other viral diseases: Secondary | ICD-10-CM

## 2020-01-10 DIAGNOSIS — Z0000 Encounter for general adult medical examination without abnormal findings: Secondary | ICD-10-CM | POA: Diagnosis not present

## 2020-01-10 DIAGNOSIS — E559 Vitamin D deficiency, unspecified: Secondary | ICD-10-CM

## 2020-01-10 NOTE — Progress Notes (Signed)
This visit occurred during the SARS-CoV-2 public health emergency.  Safety protocols were in place, including screening questions prior to the visit, additional usage of staff PPE, and extensive cleaning of exam room while observing appropriate contact time as indicated for disinfecting solutions.  Subjective:     Patient ID: Rachel Reid , female    DOB: 04/06/1976 , 44 y.o.   MRN: [id number]   Chief Complaint  Patient presents with  . Annual Exam    HPI  Here for HM.  She is doing better with the chest discomfort with taking omeprazole.     Past Medical History:  Diagnosis Date  . Anemia    hx  . Endometriosis   . Environmental allergies   . Seasonal allergies   . SVD (spontaneous vaginal delivery)    x 1     History reviewed. No pertinent family history.   Current Outpatient Medications:  .  cyclobenzaprine (FLEXERIL) 10 MG tablet, Take 1 tablet (10 mg total) by mouth 3 (three) times daily as needed for muscle spasms., Disp: 30 tablet, Rfl: 0 .  Magnesium 250 MG TABS, Take 1 tablet by mouth with evening meal, Disp: 30 tablet, Rfl: 2 .  Multiple Vitamin (MULTIVITAMIN WITH MINERALS) TABS tablet, Take 1 tablet by mouth daily., Disp: , Rfl:  .  omeprazole (PRILOSEC) 20 MG capsule, TAKE 1 CAPSULE BY MOUTH EVERY DAY, Disp: 30 capsule, Rfl: 1 .  Vitamin D, Ergocalciferol, (DRISDOL) 1.25 MG (50000 UNIT) CAPS capsule, Take 1 capsule (50,000 Units total) by mouth 2 (two) times a week., Disp: 24 capsule, Rfl: 1   Allergies  Allergen Reactions  . Sulfa Drugs Cross Reactors Hives     The patient states she uses status post hysterectomy for birth control. Last LMP was Patient's last menstrual period was 01/05/2014.. Negative for Dysmenorrhea and Negative for Menorrhagia. Negative for: breast discharge, breast lump(s), breast pain and breast self exam. Associated symptoms include abnormal vaginal bleeding. Pertinent negatives include abnormal bleeding (hematology), anxiety, decreased  libido, depression, difficulty falling sleep, dyspareunia, history of infertility, nocturia, sexual dysfunction, sleep disturbances, urinary incontinence, urinary urgency, vaginal discharge and vaginal itching. Diet regular. The patient states her exercise level is minimal - walking with steps in the house.  The patient's tobacco use is:  Social History   Tobacco Use  Smoking Status Never Smoker  Smokeless Tobacco Never Used   She has been exposed to passive smoke. The patient's alcohol use is:  Social History   Substance and Sexual Activity  Alcohol Use No  . Additional information:  She will have her PAP and Mammogram with Dr. Mikey Kirschner on August 10th  Review of Systems   Today's Vitals   01/10/20 0857  BP: 118/68  Pulse: 83  Temp: 98.1 F (36.7 C)  TempSrc: Oral  Weight: 139 lb 12.8 oz (63.4 kg)  Height: 5\' 1"  (1.549 m)  PainSc: 0-No pain   Body mass index is 26.41 kg/m.   Objective:  Physical Exam Vitals reviewed.  Constitutional:      General: She is not in acute distress.    Appearance: Normal appearance. She is well-developed. She is obese.  HENT:     Head: Normocephalic and atraumatic.     Right Ear: Hearing, tympanic membrane, ear canal and external ear normal. There is no impacted cerumen.     Left Ear: Hearing, tympanic membrane, ear canal and external ear normal. There is no impacted cerumen.     Nose:     Comments:  Deferred - mask     Mouth/Throat:     Comments: Deferred - masked Eyes:     General: Lids are normal.     Extraocular Movements: Extraocular movements intact.     Conjunctiva/sclera: Conjunctivae normal.     Pupils: Pupils are equal, round, and reactive to light.     Funduscopic exam:    Right eye: No papilledema.        Left eye: No papilledema.  Neck:     Thyroid: No thyroid mass.     Vascular: No carotid bruit.  Cardiovascular:     Rate and Rhythm: Normal rate and regular rhythm.     Pulses: Normal pulses.     Heart sounds: Normal  heart sounds. No murmur heard.   Pulmonary:     Effort: Pulmonary effort is normal.     Breath sounds: Normal breath sounds.  Abdominal:     General: Abdomen is flat. Bowel sounds are normal. There is no distension.     Palpations: Abdomen is soft.     Tenderness: There is no abdominal tenderness.  Genitourinary:    Rectum: Guaiac result negative.  Musculoskeletal:        General: No swelling. Normal range of motion.     Cervical back: Full passive range of motion without pain, normal range of motion and neck supple.     Right lower leg: No edema.     Left lower leg: No edema.  Skin:    General: Skin is warm and dry.     Capillary Refill: Capillary refill takes less than 2 seconds.  Neurological:     General: No focal deficit present.     Mental Status: She is alert and oriented to person, place, and time.     Cranial Nerves: No cranial nerve deficit.     Sensory: No sensory deficit.  Psychiatric:        Mood and Affect: Mood normal.        Behavior: Behavior normal.        Thought Content: Thought content normal.        Judgment: Judgment normal.         Assessment And Plan:     1. Health maintenance examination . Behavior modifications discussed and diet history reviewed.   . Pt will continue to exercise regularly and modify diet with low GI, plant based foods and decrease intake of processed foods.  . Recommend intake of daily multivitamin, Vitamin D, and calcium.  . Recommend mammogram (will have done at Dr. Baltazar [facility] office) for preventive screenings, as well as recommend immunizations that include influenza, TDAP (up to date) . Encouraged to take a probiotic daily. - Lipid panel  2. Encounter for hepatitis C screening test for low risk patient  Will check Hepatitis C screening due to recent recommendations to screen all adults 18 years and older - Hepatitis C antibody  3. Vitamin D deficiency  Chronic, doing well continue with vitamin d supplement   Patient  was given opportunity to ask questions. Patient verbalized understanding of the plan and was able to repeat key elements of the plan. All questions were answered to their satisfaction.   Minette Brine, FNP   I, Minette Brine, FNP, have reviewed all documentation for this visit. The documentation on 01/10/20 for the exam, diagnosis, procedures, and orders are all accurate and complete.  THE PATIENT IS ENCOURAGED TO PRACTICE SOCIAL DISTANCING DUE TO THE COVID-19 PANDEMIC.

## 2020-01-10 NOTE — Patient Instructions (Signed)
Health Maintenance, Female Adopting a healthy lifestyle and getting preventive care are important in promoting health and wellness. Ask your health care provider about:  The right schedule for you to have regular tests and exams.  Things you can do on your own to prevent diseases and keep yourself healthy. What should I know about diet, weight, and exercise? Eat a healthy diet   Eat a diet that includes plenty of vegetables, fruits, low-fat dairy products, and lean protein.  Do not eat a lot of foods that are high in solid fats, added sugars, or sodium. Maintain a healthy weight Body mass index (BMI) is used to identify weight problems. It estimates body fat based on height and weight. Your health care provider can help determine your BMI and help you achieve or maintain a healthy weight. Get regular exercise Get regular exercise. This is one of the most important things you can do for your health. Most adults should:  Exercise for at least 150 minutes each week. The exercise should increase your heart rate and make you sweat (moderate-intensity exercise).  Do strengthening exercises at least twice a week. This is in addition to the moderate-intensity exercise.  Spend less time sitting. Even light physical activity can be beneficial. Watch cholesterol and blood lipids Have your blood tested for lipids and cholesterol at 43 years of age, then have this test every 5 years. Have your cholesterol levels checked more often if:  Your lipid or cholesterol levels are high.  You are older than 43 years of age.  You are at high risk for heart disease. What should I know about cancer screening? Depending on your health history and family history, you may need to have cancer screening at various ages. This may include screening for:  Breast cancer.  Cervical cancer.  Colorectal cancer.  Skin cancer.  Lung cancer. What should I know about heart disease, diabetes, and high blood  pressure? Blood pressure and heart disease  High blood pressure causes heart disease and increases the risk of stroke. This is more likely to develop in people who have high blood pressure readings, are of African descent, or are overweight.  Have your blood pressure checked: ? Every 3-5 years if you are 18-39 years of age. ? Every year if you are 40 years old or older. Diabetes Have regular diabetes screenings. This checks your fasting blood sugar level. Have the screening done:  Once every three years after age 40 if you are at a normal weight and have a low risk for diabetes.  More often and at a younger age if you are overweight or have a high risk for diabetes. What should I know about preventing infection? Hepatitis B If you have a higher risk for hepatitis B, you should be screened for this virus. Talk with your health care provider to find out if you are at risk for hepatitis B infection. Hepatitis C Testing is recommended for:  Everyone born from 1945 through 1965.  Anyone with known risk factors for hepatitis C. Sexually transmitted infections (STIs)  Get screened for STIs, including gonorrhea and chlamydia, if: ? You are sexually active and are younger than 43 years of age. ? You are older than 43 years of age and your health care provider tells you that you are at risk for this type of infection. ? Your sexual activity has changed since you were last screened, and you are at increased risk for chlamydia or gonorrhea. Ask your health care provider if   you are at risk.  Ask your health care provider about whether you are at high risk for HIV. Your health care provider may recommend a prescription medicine to help prevent HIV infection. If you choose to take medicine to prevent HIV, you should first get tested for HIV. You should then be tested every 3 months for as long as you are taking the medicine. Pregnancy  If you are about to stop having your period (premenopausal) and  you may become pregnant, seek counseling before you get pregnant.  Take 400 to 800 micrograms (mcg) of folic acid every day if you become pregnant.  Ask for birth control (contraception) if you want to prevent pregnancy. Osteoporosis and menopause Osteoporosis is a disease in which the bones lose minerals and strength with aging. This can result in bone fractures. If you are 65 years old or older, or if you are at risk for osteoporosis and fractures, ask your health care provider if you should:  Be screened for bone loss.  Take a calcium or vitamin D supplement to lower your risk of fractures.  Be given hormone replacement therapy (HRT) to treat symptoms of menopause. Follow these instructions at home: Lifestyle  Do not use any products that contain nicotine or tobacco, such as cigarettes, e-cigarettes, and chewing tobacco. If you need help quitting, ask your health care provider.  Do not use street drugs.  Do not share needles.  Ask your health care provider for help if you need support or information about quitting drugs. Alcohol use  Do not drink alcohol if: ? Your health care provider tells you not to drink. ? You are pregnant, may be pregnant, or are planning to become pregnant.  If you drink alcohol: ? Limit how much you use to 0-1 drink a day. ? Limit intake if you are breastfeeding.  Be aware of how much alcohol is in your drink. In the [place] one drink equals one 12 oz bottle of beer (355 mL), one 5 oz glass of wine (148 mL), or one 1 oz glass of hard liquor (44 mL). General instructions  Schedule regular health, dental, and eye exams.  Stay current with your vaccines.  Tell your health care provider if: ? You often feel depressed. ? You have ever been abused or do not feel safe at home. Summary  Adopting a healthy lifestyle and getting preventive care are important in promoting health and wellness.  Follow your health care provider's instructions about healthy  diet, exercising, and getting tested or screened for diseases.  Follow your health care provider's instructions on monitoring your cholesterol and blood pressure. This information is not intended to replace advice given to you by your health care provider. Make sure you discuss any questions you have with your health care provider. Document Revised: 05/25/2018 Document Reviewed: 05/25/2018 Elsevier Patient Education  2020 [employer]  

## 2020-01-11 LAB — LIPID PANEL
Chol/HDL Ratio: 3 ratio (ref 0.0–4.4)
Cholesterol, Total: 169 mg/dL (ref 100–199)
HDL: 57 mg/dL (ref >39)
LDL Chol Calc (NIH): 101 mg/dL — ABNORMAL HIGH (ref 0–99)
Triglycerides: 58 mg/dL (ref 0–149)
VLDL Cholesterol Cal: 11 mg/dL (ref 5–40)

## 2020-01-11 LAB — HEPATITIS C ANTIBODY: Hep C Virus Ab: <0.1 s/co ratio (ref 0.0–0.9)

## 2020-01-20 ENCOUNTER — Other Ambulatory Visit: Payer: Self-pay | Admitting: Nurse Practitioner

## 2020-01-20 DIAGNOSIS — R0789 Other chest pain: Secondary | ICD-10-CM

## 2020-01-23 ENCOUNTER — Encounter: Payer: Self-pay | Admitting: Cardiovascular Disease

## 2020-01-23 ENCOUNTER — Ambulatory Visit (INDEPENDENT_AMBULATORY_CARE_PROVIDER_SITE_OTHER): Payer: 59 | Admitting: Cardiovascular Disease

## 2020-01-23 ENCOUNTER — Other Ambulatory Visit: Payer: Self-pay

## 2020-01-23 VITALS — BP 122/84 | HR 76 | Ht 61.0 in | Wt 128.0 lb

## 2020-01-23 DIAGNOSIS — R0600 Dyspnea, unspecified: Secondary | ICD-10-CM

## 2020-01-23 DIAGNOSIS — R002 Palpitations: Secondary | ICD-10-CM | POA: Insufficient documentation

## 2020-01-23 DIAGNOSIS — R0789 Other chest pain: Secondary | ICD-10-CM

## 2020-01-23 DIAGNOSIS — R079 Chest pain, unspecified: Secondary | ICD-10-CM

## 2020-01-23 DIAGNOSIS — R0609 Other forms of dyspnea: Secondary | ICD-10-CM

## 2020-01-23 DIAGNOSIS — K219 Gastro-esophageal reflux disease without esophagitis: Secondary | ICD-10-CM

## 2020-01-23 HISTORY — DX: Other forms of dyspnea: R06.09

## 2020-01-23 HISTORY — DX: Other chest pain: R07.89

## 2020-01-23 HISTORY — DX: Dyspnea, unspecified: R06.00

## 2020-01-23 HISTORY — DX: Gastro-esophageal reflux disease without esophagitis: K21.9

## 2020-01-23 HISTORY — DX: Palpitations: R00.2

## 2020-01-23 MED ORDER — METOPROLOL TARTRATE 100 MG PO TABS
ORAL_TABLET | ORAL | 0 refills | Status: DC
Start: 2020-01-23 — End: 2021-01-15

## 2020-01-23 NOTE — Patient Instructions (Addendum)
Medication Instructions:  TAKE METOPROLOL 100 MG 1 TABLET 2 HOURS PRIOR TO CARDIAC CT *If you need a refill on your cardiac medications before your next appointment, please call your pharmacy*  Lab Work: BMET 1 WEEK PRIOR TO CARDIAC CT   If you have labs (blood work) drawn today and your tests are completely normal, you will receive your results only by: Marland Kitchen MyChart Message (if you have MyChart) OR . A paper copy in the mail If you have any lab test that is abnormal or we need to change your treatment, we will call you to review the results.  Testing/Procedures: Your physician has requested that you have cardiac CT. Cardiac computed tomography (CT) is a painless test that uses an x-ray machine to take clear, detailed pictures of your heart. For further information please visit [email or web address]. Please follow instruction sheet as given. CALL THE OFFICE [phone]) IN September AFTER YOU GET YOUR NEW INSURANCE  Follow-Up: AS NEEDED   Other Instructions  Your cardiac CT will be scheduled at one of the below locations:   [facility] [address] [place], [place] [address] [phone]  [place] [address] [place], [place] [address] [phone]  If scheduled at [facility], please arrive at the [facility] main entrance of [facility] 30 minutes prior to test start time. Proceed to the [facility] Radiology Department (first floor) to check-in and test prep.  If scheduled at [facility], please arrive 15 mins early for check-in and test prep.  Please follow these instructions carefully (unless otherwise directed):  Hold all erectile dysfunction medications at least 3 days (72 hrs) prior to test.  On the Night Before the Test: . Be sure to Drink plenty of water. . Do not consume any caffeinated/decaffeinated beverages or chocolate 12 hours prior to your  test. . Do not take any antihistamines 12 hours prior to your test. . If the patient has contrast allergy: ? Patient will need a prescription for Prednisone and very clear instructions (as follows): 1. Prednisone 50 mg - take 13 hours prior to test 2. Take another Prednisone 50 mg 7 hours prior to test 3. Take another Prednisone 50 mg 1 hour prior to test 4. Take Benadryl 50 mg 1 hour prior to test . Patient must complete all four doses of above prophylactic medications. . Patient will need a ride after test due to Benadryl.  On the Day of the Test: . Drink plenty of water. Do not drink any water within one hour of the test. . Do not eat any food 4 hours prior to the test. . You may take your regular medications prior to the test.  . Take metoprolol (Lopressor) two hours prior to test. . HOLD Furosemide/Hydrochlorothiazide morning of the test. . FEMALES- please wear underwire-free bra if available      After the Test: . Drink plenty of water. . After receiving IV contrast, you may experience a mild flushed feeling. This is normal. . On occasion, you may experience a mild rash up to 24 hours after the test. This is not dangerous. If this occurs, you can take Benadryl 25 mg and increase your fluid intake. . If you experience trouble breathing, this can be serious. If it is severe call 911 IMMEDIATELY. If it is mild, please call our office. . If you take any of these medications: Glipizide/Metformin, Avandament, Glucavance, please do not take 48 hours after  completing test unless otherwise instructed.   Once we have confirmed authorization from your insurance company, we will call you to set up a date and time for your test. Based on how quickly your insurance processes prior authorizations requests, please allow up to 4 weeks to be contacted for scheduling your Cardiac CT appointment. Be advised that routine Cardiac CT appointments could be scheduled as many as 8 weeks after your provider  has ordered it.  For non-scheduling related questions, please contact the cardiac imaging nurse navigator should you have any questions/concerns: Marchia Bond, Cardiac Imaging Nurse Navigator Burley Saver, Interim Cardiac Imaging Nurse [place] [facility] Direct Office Dial: [phone]   For scheduling needs, including cancellations and rescheduling, please call Vivien Rota at [phone], option 3.    Cardiac CT Angiogram A cardiac CT angiogram is a procedure to look at the heart and the area around the heart. It may be done to help find the cause of chest pains or other symptoms of heart disease. During this procedure, a substance called contrast dye is injected into the blood vessels in the area to be checked. A large X-ray machine, called a CT scanner, then takes detailed pictures of the heart and the surrounding area. The procedure is also sometimes called a coronary CT angiogram, coronary artery scanning, or CTA. A cardiac CT angiogram allows the health care provider to see how well blood is flowing to and from the heart. The health care provider will be able to see if there are any problems, such as:  Blockage or narrowing of the coronary arteries in the heart.  Fluid around the heart.  Signs of weakness or disease in the muscles, valves, and tissues of the heart. Tell a health care provider about:  Any allergies you have. This is especially important if you have had a previous allergic reaction to contrast dye.  All medicines you are taking, including vitamins, herbs, eye drops, creams, and over-the-counter medicines.  Any blood disorders you have.  Any surgeries you have had.  Any medical conditions you have.  Whether you are pregnant or may be pregnant.  Any anxiety disorders, chronic pain, or other conditions you have that may increase your stress or prevent you from lying still. What are the risks? Generally, this is a safe procedure. However,  problems may occur, including:  Bleeding.  Infection.  Allergic reactions to medicines or dyes.  Damage to other structures or organs.  Kidney damage from the contrast dye that is used.  Increased risk of cancer from radiation exposure. This risk is low. Talk with your health care provider about: ? The risks and benefits of testing. ? How you can receive the lowest dose of radiation. What happens before the procedure?  Wear comfortable clothing and remove any jewelry, glasses, dentures, and hearing aids.  Follow instructions from your health care provider about eating and drinking. This may include: ? For 12 hours before the procedure -- avoid caffeine. This includes tea, coffee, soda, energy drinks, and diet pills. Drink plenty of water or other fluids that do not have caffeine in them. Being well hydrated can prevent complications. ? For 4-6 hours before the procedure -- stop eating and drinking. The contrast dye can cause nausea, but this is less likely if your stomach is empty.  Ask your health care provider about changing or stopping your regular medicines. This is especially important if you are taking diabetes medicines, blood thinners, or medicines to treat problems with erections (  erectile dysfunction). What happens during the procedure?   Hair on your chest may need to be removed so that small sticky patches called electrodes can be placed on your chest. These will transmit information that helps to monitor your heart during the procedure.  An IV will be inserted into one of your veins.  You might be given a medicine to control your heart rate during the procedure. This will help to ensure that good images are obtained.  You will be asked to lie on an exam table. This table will slide in and out of the CT machine during the procedure.  Contrast dye will be injected into the IV. You might feel warm, or you may get a metallic taste in your mouth.  You will be given a  medicine called nitroglycerin. This will relax or dilate the arteries in your heart.  The table that you are lying on will move into the CT machine tunnel for the scan.  The person running the machine will give you instructions while the scans are being done. You may be asked to: ? Keep your arms above your head. ? Hold your breath. ? Stay very still, even if the table is moving.  When the scanning is complete, you will be moved out of the machine.  The IV will be removed. The procedure may vary among health care providers and hospitals. What can I expect after the procedure? After your procedure, it is common to have:  A metallic taste in your mouth from the contrast dye.  A feeling of warmth.  A headache from the nitroglycerin. Follow these instructions at home:  Take over-the-counter and prescription medicines only as told by your health care provider.  If you are told, drink enough fluid to keep your urine pale yellow. This will help to flush the contrast dye out of your body.  Most people can return to their normal activities right after the procedure. Ask your health care provider what activities are safe for you.  It is up to you to get the results of your procedure. Ask your health care provider, or the department that is doing the procedure, when your results will be ready.  Keep all follow-up visits as told by your health care provider. This is important. Contact a health care provider if:  You have any symptoms of allergy to the contrast dye. These include: ? Shortness of breath. ? Rash or hives. ? A racing heartbeat. Summary  A cardiac CT angiogram is a procedure to look at the heart and the area around the heart. It may be done to help find the cause of chest pains or other symptoms of heart disease.  During this procedure, a large X-ray machine, called a CT scanner, takes detailed pictures of the heart and the surrounding area after a contrast dye has been  injected into blood vessels in the area.  Ask your health care provider about changing or stopping your regular medicines before the procedure. This is especially important if you are taking diabetes medicines, blood thinners, or medicines to treat erectile dysfunction.  If you are told, drink enough fluid to keep your urine pale yellow. This will help to flush the contrast dye out of your body. This information is not intended to replace advice given to you by your health care provider. Make sure you discuss any questions you have with your health care provider. Document Revised: 01/25/2019 Document Reviewed: 01/25/2019 Elsevier Patient Education  [employer].

## 2020-01-23 NOTE — Progress Notes (Signed)
Cardiology Office Note   Date:  01/23/2020   ID:  Rachel Reid, DOB Jun 12, 1976, MRN [id number]  PCP:  Rachel Brine, FNP  Cardiologist:   Rachel Latch, MD   No chief complaint on file.    History of Present Illness: Rachel Reid is a 44 y.o. female with GERD who is being seen today for the evaluation of chest discomfort and incomplete RBBB at the request of Rachel Reid, Rachel Reid.  Rachel Reid was seen in the ED 11/2019 with chest pain.  The symptoms have been ongoing intermittently for about a year.  She had chest discomfort that radiated into her left arm as well as back pain and lightheadedness.  Cardiac enzymes were negative x2.  She was subsequently started on omeprazole.  Prior to that she had some episodes of chest tightness and fluttering.  It sometimes felt like her bra was too tight.  Seem to be worse when laying in bed at night.  She is also noted some exertional dyspnea.  She notices especially when going up the stairs while carrying laundry.  Today she walked to our office briskly and up the steps and still feels like her bra is too tight.  She has some random episodes of lightheadedness that are not associated with chest pain or shortness of breath.  When her heart races she feels like it last for couple minutes at a time.  She does not drink any caffeine or alcohol.  She has been under some stress lately due to the pandemic and her 3 daughters.  She has no lower extremity edema, orthopnea, or PND.  She walks for exercise 3-4 times per week.  She walks for about a mile which takes about 30 to 40 minutes.  She sometimes has some chest tightness when she gets back but not while walking.  She denies any lower extremity edema, orthopnea, or PND.   Past Medical History:  Diagnosis Date  . Anemia    hx  . Atypical chest pain 01/23/2020  . Endometriosis   . Environmental allergies   . Exertional dyspnea 01/23/2020  . GERD (gastroesophageal reflux disease) 01/23/2020  . Palpitations  01/23/2020  . Seasonal allergies   . SVD (spontaneous vaginal delivery)    x 1    Past Surgical History:  Procedure Laterality Date  . BILATERAL SALPINGECTOMY Bilateral 01/15/2014   Procedure: BILATERAL SALPINGECTOMY;  Surgeon: Rachel Chamber, MD;  Location: [facility];  Service: Gynecology;  Laterality: Bilateral;  . CESAREAN SECTION     x 1  . LAPAROSCOPIC ASSISTED VAGINAL HYSTERECTOMY N/A 01/15/2014   Procedure: LAPAROSCOPIC ASSISTED VAGINAL HYSTERECTOMY;  Surgeon: Rachel Chamber, MD;  Location: [facility];  Service: Gynecology;  Laterality: N/A;  . TUBAL LIGATION     lap tubal  . WISDOM TOOTH EXTRACTION       Current Outpatient Medications  Medication Sig Dispense Refill  . cyclobenzaprine (FLEXERIL) 10 MG tablet Take 1 tablet (10 mg total) by mouth 3 (three) times daily as needed for muscle spasms. 30 tablet 0  . Magnesium 250 MG TABS Take 1 tablet by mouth with evening meal 30 tablet 2  . Multiple Vitamin (MULTIVITAMIN WITH MINERALS) TABS tablet Take 1 tablet by mouth daily.    Marland Kitchen omeprazole (PRILOSEC) 20 MG capsule TAKE 1 CAPSULE BY MOUTH EVERY DAY 30 capsule 1  . Vitamin D, Ergocalciferol, (DRISDOL) 1.25 MG (50000 UNIT) CAPS capsule Take 1 capsule (50,000 Units total) by mouth 2 (two) times a week. 24  capsule 1  . metoprolol tartrate (LOPRESSOR) 100 MG tablet TAKE 1 TABLET 2 HOURS PRIOR TO CT 1 tablet 0   No current facility-administered medications for this visit.    Allergies:   Sulfa drugs cross reactors    Social History:  The patient  reports that she has never smoked. She has never used smokeless tobacco. She reports that she does not drink alcohol and does not use drugs.   Family History:  The patient's family history includes Atrial fibrillation in her father.    ROS:  Please see the history of present illness.   Otherwise, review of systems are positive for none.   All other systems are reviewed and negative.    PHYSICAL EXAM: VS:  BP 122/84   Pulse 76   Ht 5\' 1"  (1.549  m)   Wt 128 lb (58.1 kg)   LMP 01/05/2014   SpO2 100%   BMI 24.19 kg/m  , BMI Body mass index is 24.19 kg/m. GENERAL:  Well appearing HEENT:  Pupils equal round and reactive, fundi not visualized, oral mucosa unremarkable NECK:  No jugular venous distention, waveform within normal limits, carotid upstroke brisk and symmetric, no bruits, no thyromegaly LYMPHATICS:  No cervical adenopathy LUNGS:  Clear to auscultation bilaterally HEART:  RRR.  PMI not displaced or sustained,S1 and S2 within normal limits, no S3, no S4, no clicks, no rubs, no murmurs ABD:  Flat, positive bowel sounds normal in frequency in pitch, no bruits, no rebound, no guarding, no midline pulsatile mass, no hepatomegaly, no splenomegaly EXT:  2 plus pulses throughout, no edema, no cyanosis no clubbing SKIN:  No rashes no nodules NEURO:  Cranial nerves II through XII grossly intact, motor grossly intact throughout PSYCH:  Cognitively intact, oriented to person place and time    EKG:  EKG is ordered today. The ekg ordered today demonstrates sinus rhythm.  Rate 76 bpm.   Recent Labs: 11/28/2019: BUN 13; Creatinine, Ser 0.82; Hemoglobin 12.9; Platelets 207; Potassium 4.6; Sodium 137 12/06/2019: TSH 1.340    Lipid Panel    Component Value Date/Time   CHOL 169 01/10/2020 1003   TRIG 58 01/10/2020 1003   HDL 57 01/10/2020 1003   CHOLHDL 3.0 01/10/2020 1003   LDLCALC 101 (H) 01/10/2020 1003      Wt Readings from Last 3 Encounters:  01/23/20 128 lb (58.1 kg)  01/10/20 139 lb 12.8 oz (63.4 kg)  12/06/19 141 lb (64 kg)      ASSESSMENT AND PLAN:  # Atypical chest pain:  # Exertional dyspnea:   Rachel Reid chest pain has resolved with treatment of her GERD.  She does continue to have some exertional dyspnea.  We will get a coronary CT-a to make sure there is no evidence of coronary disease.  # Palpitations: Resolved with treatment of GERD.  She does have a family history of atrial fibrillation.  If she has  recurrent episode she will let us know and we will get an ambulatory monitor.  Thyroid function, blood counts, and electrolytes were within normal limits.  # Incomplete RBBB: Not clinically significant.   Current medicines are reviewed at length with the patient today.  The patient does not have concerns regarding medicines.  The following changes have been made:  no change  Labs/ tests ordered today include:   Orders Placed This Encounter  Procedures  . CT CORONARY MORPH W/CTA COR W/SCORE W/CA W/CM &/OR WO/CM  . CT CORONARY FRACTIONAL FLOW RESERVE DATA PREP  .  CT CORONARY FRACTIONAL FLOW RESERVE FLUID ANALYSIS  . Basic metabolic panel     Disposition:   FU with Donnavin Vandenbrink C. Oval Linsey, MD, [facility] as needed.     Signed, Chasitie Passey C. Oval Linsey, MD, [facility]  01/23/2020 1:02 PM    [place]

## 2020-01-25 NOTE — Addendum Note (Signed)
Addended by: Hinton Dyer on: 01/25/2020 04:34 PM   Modules accepted: Orders

## 2020-02-04 ENCOUNTER — Other Ambulatory Visit: Payer: Self-pay | Admitting: Nurse Practitioner

## 2020-02-04 DIAGNOSIS — R0789 Other chest pain: Secondary | ICD-10-CM

## 2020-02-26 ENCOUNTER — Other Ambulatory Visit: Payer: Self-pay | Admitting: Nurse Practitioner

## 2020-02-26 DIAGNOSIS — R5383 Other fatigue: Secondary | ICD-10-CM

## 2020-02-26 DIAGNOSIS — R002 Palpitations: Secondary | ICD-10-CM

## 2020-04-29 ENCOUNTER — Telehealth: Payer: Self-pay | Admitting: *Deleted

## 2020-04-29 NOTE — Telephone Encounter (Signed)
-----   Message from Roosvelt Maser sent at 03/18/2020 12:47 PM EDT ----- We have tried to call this patient. Left message on 8/27.  Tried again but mailbox was full on 9/14  We will try again to reach pt  Thanks, Vivien Rota  ----- Message ----- From: Earvin Hansen, LPN Sent: 02/19/2835  12:37 PM EDT To: April H Pait, Roosvelt Maser, #  Hello all Just following up on cardiac ct patient needed Thanks [facility]

## 2020-07-16 ENCOUNTER — Ambulatory Visit: Payer: 59 | Admitting: Nurse Practitioner

## 2020-08-06 ENCOUNTER — Ambulatory Visit: Payer: BC Managed Care – PPO | Admitting: Nurse Practitioner

## 2020-08-06 ENCOUNTER — Other Ambulatory Visit: Payer: Self-pay

## 2020-08-06 ENCOUNTER — Encounter: Payer: Self-pay | Admitting: Nurse Practitioner

## 2020-08-06 VITALS — BP 128/68 | HR 76 | Temp 98.1°F | Ht 61.0 in | Wt 139.0 lb

## 2020-08-06 DIAGNOSIS — E559 Vitamin D deficiency, unspecified: Secondary | ICD-10-CM | POA: Diagnosis not present

## 2020-08-06 MED ORDER — VITAMIN D (ERGOCALCIFEROL) 1.25 MG (50000 UNIT) PO CAPS: 50000.0000 [IU] | ORAL_CAPSULE | ORAL | 1 refills | Status: DC

## 2020-08-06 NOTE — Patient Instructions (Signed)
Vitamin D Deficiency Vitamin D deficiency is when your body does not have enough vitamin D. Vitamin D is important to your body because:  It helps your body use other minerals.  It helps to keep your bones strong and healthy.  It may help to prevent some diseases.  It helps your heart and other muscles work well. Not getting enough vitamin D can make your bones soft. It can also cause other health problems. What are the causes? This condition may be caused by:  Not eating enough foods that contain vitamin D.  Not getting enough sun.  Having diseases that make it hard for your body to absorb vitamin D.  Having a surgery in which a part of the stomach or a part of the small intestine is removed.  Having kidney disease or liver disease. What increases the risk? You are more likely to get this condition if:  You are older.  You do not spend much time outdoors.  You live in a nursing home.  You have had broken bones.  You have weak or thin bones (osteoporosis).  You have a disease or condition that changes how your body absorbs vitamin D.  You have dark skin.  You take certain medicines.  You are overweight or obese. What are the signs or symptoms?  In mild cases, there may not be any symptoms. If the condition is very bad, symptoms may include: ? Bone pain. ? Muscle pain. ? Falling often. ? Broken bones caused by a minor injury. How is this treated? Treatment may include taking supplements as told by your doctor. Your doctor will tell you what dose is best for you. Supplements may include:  Vitamin D.  Calcium. Follow these instructions at home: Eating and drinking  Eat foods that contain vitamin D, such as: ? Dairy products, cereals, or juices with added vitamin D. Check the label. ? Fish, such as salmon or trout. ? Eggs. ? Oysters. ? Mushrooms. The items listed above may not be a complete list of what you can eat and drink. Contact a dietitian for more  options.   General instructions  Take medicines and supplements only as told by your doctor.  Get regular, safe exposure to natural sunlight.  Do not use a tanning bed.  Maintain a healthy weight. Lose weight if needed.  Keep all follow-up visits as told by your doctor. This is important. How is this prevented?  You can get vitamin D by: ? Eating foods that naturally contain vitamin D. ? Eating or drinking products that have vitamin D added to them, such as cereals, juices, and milk. ? Taking vitamin D or a multivitamin that contains vitamin D. ? Being in the sun. Your body makes vitamin D when your skin is exposed to sunlight. Your body changes the sunlight into a form of the vitamin that it can use. Contact a doctor if:  Your symptoms do not go away.  You feel sick to your stomach (nauseous).  You throw up (vomit).  You poop less often than normal, or you have trouble pooping (constipation). Summary  Vitamin D deficiency is when your body does not have enough vitamin D.  Vitamin D helps to keep your bones strong and healthy.  This condition is often treated by taking a supplement.  Your doctor will tell you what dose is best for you. This information is not intended to replace advice given to you by your health care provider. Make sure you discuss any questions   you have with your health care provider. Document Revised: 02/07/2018 Document Reviewed: 02/07/2018 Elsevier Patient Education  2021 Elsevier Inc.  

## 2020-08-06 NOTE — Progress Notes (Signed)
I,Yamilka Roman [employer] as a Education administrator for [employer], FNP.,have documented all relevant documentation on the behalf of Minette Brine, FNP,as directed by  Minette Brine, FNP while in the presence of Minette Brine, [place]. This visit occurred during the SARS-CoV-2 public health emergency.  Safety protocols were in place, including screening questions prior to the visit, additional usage of staff PPE, and extensive cleaning of exam room while observing appropriate contact time as indicated for disinfecting solutions.  Subjective:     Patient ID: Rachel Reid , female    DOB: 07/23/1975 , 45 y.o.   MRN: [id number]   Chief Complaint  Patient presents with  . Vitamin D recheck    HPI  Patient presents today for a vitamin d recheck.  She goes to [facility] for her PAP   Wt Readings from Last 3 Encounters: 08/06/20 : 139 lb (63 kg) 01/23/20 : 128 lb (58.1 kg) 01/10/20 : 139 lb 12.8 oz (63.4 kg)       Past Medical History:  Diagnosis Date  . Anemia    hx  . Atypical chest pain 01/23/2020  . Endometriosis   . Environmental allergies   . Exertional dyspnea 01/23/2020  . GERD (gastroesophageal reflux disease) 01/23/2020  . Palpitations 01/23/2020  . Seasonal allergies   . SVD (spontaneous vaginal delivery)    x 1     Family History  Problem Relation Age of Onset  . Atrial fibrillation Father      Current Outpatient Medications:  .  metoprolol tartrate (LOPRESSOR) 100 MG tablet, TAKE 1 TABLET 2 HOURS PRIOR TO CT, Disp: 1 tablet, Rfl: 0 .  Multiple Vitamin (MULTIVITAMIN WITH MINERALS) TABS tablet, Take 1 tablet by mouth daily., Disp: , Rfl:  .  Vitamin D, Ergocalciferol, (DRISDOL) 1.25 MG (50000 UNIT) CAPS capsule, Take 1 capsule (50,000 Units total) by mouth 2 (two) times a week., Disp: 24 capsule, Rfl: 1   Allergies  Allergen Reactions  . Sulfa Drugs Cross Reactors Hives     Review of Systems  Constitutional: Negative.   HENT: Negative.   Eyes: Negative.   Respiratory:  Negative.   Cardiovascular: Negative.   Gastrointestinal: Negative.   Endocrine: Negative.   Genitourinary: Negative.   Musculoskeletal: Negative.   Skin: Negative.   Neurological: Negative.   Hematological: Negative.   Psychiatric/Behavioral: Negative.      Today's Vitals   08/06/20 1517  BP: 128/68  Pulse: 76  Temp: 98.1 F (36.7 C)  TempSrc: Oral  Weight: 139 lb (63 kg)  Height: 5\' 1"  (1.549 m)  PainSc: 0-No pain   Body mass index is 26.26 kg/m.   Objective:  Physical Exam Constitutional:      General: She is not in acute distress.    Appearance: Normal appearance. She is normal weight.  Cardiovascular:     Rate and Rhythm: Normal rate and regular rhythm.     Pulses: Normal pulses.     Heart sounds: Normal heart sounds. No murmur heard.   Pulmonary:     Effort: Pulmonary effort is normal. No respiratory distress.     Breath sounds: Normal breath sounds. No wheezing.  Musculoskeletal:     Cervical back: Normal range of motion and neck supple.  Skin:    General: Skin is warm and dry.     Capillary Refill: Capillary refill takes less than 2 seconds.  Neurological:     General: No focal deficit present.     Mental Status: She is alert and oriented to  person, place, and time.  Psychiatric:        Mood and Affect: Mood normal.        Behavior: Behavior normal.        Thought Content: Thought content normal.        Judgment: Judgment normal.         Assessment And Plan:     1. Vitamin D deficiency Chronic, will check vitamin d levels  Will check vitamin D level and supplement as needed.     Also encouraged to spend 15 minutes in the sun daily.  - Vitamin D (25 hydroxy) - Vitamin D, Ergocalciferol, (DRISDOL) 1.25 MG (50000 UNIT) CAPS capsule; Take 1 capsule (50,000 Units total) by mouth 2 (two) times a week.  Dispense: 24 capsule; Refill: 1     Patient was given opportunity to ask questions. Patient verbalized understanding of the plan and was able to  repeat key elements of the plan. All questions were answered to their satisfaction.  Minette Brine, FNP    I, Minette Brine, FNP, have reviewed all documentation for this visit. The documentation on 08/06/20 for the exam, diagnosis, procedures, and orders are all accurate and complete.   THE PATIENT IS ENCOURAGED TO PRACTICE SOCIAL DISTANCING DUE TO THE COVID-19 PANDEMIC.

## 2020-08-07 LAB — VITAMIN D 25 HYDROXY (VIT D DEFICIENCY, FRACTURES): Vit D, 25-Hydroxy: 24.4 ng/mL — ABNORMAL LOW (ref 30.0–100.0)

## 2021-01-15 ENCOUNTER — Encounter: Payer: Self-pay | Admitting: Nurse Practitioner

## 2021-01-15 ENCOUNTER — Ambulatory Visit (INDEPENDENT_AMBULATORY_CARE_PROVIDER_SITE_OTHER): Payer: BC Managed Care – PPO | Admitting: Nurse Practitioner

## 2021-01-15 ENCOUNTER — Other Ambulatory Visit: Payer: Self-pay

## 2021-01-15 VITALS — BP 110/62 | HR 83 | Temp 98.9°F | Ht 62.0 in | Wt 128.6 lb

## 2021-01-15 DIAGNOSIS — E7800 Pure hypercholesterolemia, unspecified: Secondary | ICD-10-CM | POA: Diagnosis not present

## 2021-01-15 DIAGNOSIS — Z0000 Encounter for general adult medical examination without abnormal findings: Secondary | ICD-10-CM

## 2021-01-15 DIAGNOSIS — E559 Vitamin D deficiency, unspecified: Secondary | ICD-10-CM

## 2021-01-15 NOTE — Patient Instructions (Signed)
Health Maintenance, Female Adopting a healthy lifestyle and getting preventive care are important in promoting health and wellness. Ask your health care provider about: The right schedule for you to have regular tests and exams. Things you can do on your own to prevent diseases and keep yourself healthy. What should I know about diet, weight, and exercise? Eat a healthy diet  Eat a diet that includes plenty of vegetables, fruits, low-fat dairy products, and lean protein. Do not eat a lot of foods that are high in solid fats, added sugars, or sodium.  Maintain a healthy weight Body mass index (BMI) is used to identify weight problems. It estimates body fat based on height and weight. Your health care provider can help determineyour BMI and help you achieve or maintain a healthy weight. Get regular exercise Get regular exercise. This is one of the most important things you can do for your health. Most adults should: Exercise for at least 150 minutes each week. The exercise should increase your heart rate and make you sweat (moderate-intensity exercise). Do strengthening exercises at least twice a week. This is in addition to the moderate-intensity exercise. Spend less time sitting. Even light physical activity can be beneficial. Watch cholesterol and blood lipids Have your blood tested for lipids and cholesterol at 44 years of age, then havethis test every 5 years. Have your cholesterol levels checked more often if: Your lipid or cholesterol levels are high. You are older than 44 years of age. You are at high risk for heart disease. What should I know about cancer screening? Depending on your health history and family history, you may need to have cancer screening at various ages. This may include screening for: Breast cancer. Cervical cancer. Colorectal cancer. Skin cancer. Lung cancer. What should I know about heart disease, diabetes, and high blood pressure? Blood pressure and heart  disease High blood pressure causes heart disease and increases the risk of stroke. This is more likely to develop in people who have high blood pressure readings, are of African descent, or are overweight. Have your blood pressure checked: Every 3-5 years if you are 18-39 years of age. Every year if you are 40 years old or older. Diabetes Have regular diabetes screenings. This checks your fasting blood sugar level. Have the screening done: Once every three years after age 40 if you are at a normal weight and have a low risk for diabetes. More often and at a younger age if you are overweight or have a high risk for diabetes. What should I know about preventing infection? Hepatitis B If you have a higher risk for hepatitis B, you should be screened for this virus. Talk with your health care provider to find out if you are at risk forhepatitis B infection. Hepatitis C Testing is recommended for: Everyone born from 1945 through 1965. Anyone with known risk factors for hepatitis C. Sexually transmitted infections (STIs) Get screened for STIs, including gonorrhea and chlamydia, if: You are sexually active and are younger than 44 years of age. You are older than 44 years of age and your health care provider tells you that you are at risk for this type of infection. Your sexual activity has changed since you were last screened, and you are at increased risk for chlamydia or gonorrhea. Ask your health care provider if you are at risk. Ask your health care provider about whether you are at high risk for HIV. Your health care provider may recommend a prescription medicine to help   prevent HIV infection. If you choose to take medicine to prevent HIV, you should first get tested for HIV. You should then be tested every 3 months for as long as you are taking the medicine. Pregnancy If you are about to stop having your period (premenopausal) and you may become pregnant, seek counseling before you get  pregnant. Take 400 to 800 micrograms (mcg) of folic acid every day if you become pregnant. Ask for birth control (contraception) if you want to prevent pregnancy. Osteoporosis and menopause Osteoporosis is a disease in which the bones lose minerals and strength with aging. This can result in bone fractures. If you are 65 years old or older, or if you are at risk for osteoporosis and fractures, ask your health care provider if you should: Be screened for bone loss. Take a calcium or vitamin D supplement to lower your risk of fractures. Be given hormone replacement therapy (HRT) to treat symptoms of menopause. Follow these instructions at home: Lifestyle Do not use any products that contain nicotine or tobacco, such as cigarettes, e-cigarettes, and chewing tobacco. If you need help quitting, ask your health care provider. Do not use street drugs. Do not share needles. Ask your health care provider for help if you need support or information about quitting drugs. Alcohol use Do not drink alcohol if: Your health care provider tells you not to drink. You are pregnant, may be pregnant, or are planning to become pregnant. If you drink alcohol: Limit how much you use to 0-1 drink a day. Limit intake if you are breastfeeding. Be aware of how much alcohol is in your drink. In the [place] one drink equals one 12 oz bottle of beer (355 mL), one 5 oz glass of wine (148 mL), or one 1 oz glass of hard liquor (44 mL). General instructions Schedule regular health, dental, and eye exams. Stay current with your vaccines. Tell your health care provider if: You often feel depressed. You have ever been abused or do not feel safe at home. Summary Adopting a healthy lifestyle and getting preventive care are important in promoting health and wellness. Follow your health care provider's instructions about healthy diet, exercising, and getting tested or screened for diseases. Follow your health care provider's  instructions on monitoring your cholesterol and blood pressure. This information is not intended to replace advice given to you by your health care provider. Make sure you discuss any questions you have with your healthcare provider. Document Revised: 05/25/2018 Document Reviewed: 05/25/2018 Elsevier Patient Education  2022 Elsevier Inc.  

## 2021-01-15 NOTE — Progress Notes (Signed)
I,Tianna Badgett,acting as a Education administrator for [employer], FNP.,have documented all relevant documentation on the behalf of Minette Brine, FNP,as directed by  Minette Brine, FNP while in the presence of Minette Brine, [place].  This visit occurred during the SARS-CoV-2 public health emergency.  Safety protocols were in place, including screening questions prior to the visit, additional usage of staff PPE, and extensive cleaning of exam room while observing appropriate contact time as indicated for disinfecting solutions.  Subjective:     Patient ID: Rachel Reid , female    DOB: [birth year] , 45 y.o.   MRN: [id number]   Chief Complaint  Patient presents with   Annual Exam    HPI  Here for HM.  She is followed by [facility] for her GYN care. No current issues or concerns    Past Medical History:  Diagnosis Date   Anemia    hx   Atypical chest pain 01/23/2020   Endometriosis    Environmental allergies    Exertional dyspnea 01/23/2020   GERD (gastroesophageal reflux disease) 01/23/2020   Palpitations 01/23/2020   Seasonal allergies    SVD (spontaneous vaginal delivery)    x 1     Family History  Problem Relation Age of Onset   Atrial fibrillation Father      Current Outpatient Medications:    Multiple Vitamin (MULTIVITAMIN WITH MINERALS) TABS tablet, Take 1 tablet by mouth daily., Disp: , Rfl:    Vitamin D, Ergocalciferol, (DRISDOL) 1.25 MG (50000 UNIT) CAPS capsule, Take 1 capsule (50,000 Units total) by mouth 2 (two) times a week., Disp: 24 capsule, Rfl: 1   Allergies  Allergen Reactions   Sulfa Drugs Cross Reactors Hives      The patient states she uses status post hysterectomy.  Last LMP was Patient's last menstrual period was 01/05/2014.. Negative for Dysmenorrhea and Negative for Menorrhagia. Negative for: breast discharge, breast lump(s), breast pain and breast self exam. Associated symptoms include abnormal vaginal bleeding. Pertinent negatives include abnormal  bleeding (hematology), anxiety, decreased libido, depression, difficulty falling sleep, dyspareunia, history of infertility, nocturia, sexual dysfunction, sleep disturbances, urinary incontinence, urinary urgency, vaginal discharge and vaginal itching. Diet regular. The patient states her exercise level is none, just does physical activity.   The patient's tobacco use is:  Social History   Tobacco Use  Smoking Status Never  Smokeless Tobacco Never   She has been exposed to passive smoke. The patient's alcohol use is:  Social History   Substance and Sexual Activity  Alcohol Use No   Additional information: Last pap 10/02/2020, next one scheduled for 10/02/2021.    Review of Systems  Constitutional: Negative.   HENT: Negative.    Eyes: Negative.   Respiratory: Negative.    Cardiovascular: Negative.   Gastrointestinal: Negative.   Endocrine: Negative.   Genitourinary: Negative.   Musculoskeletal: Negative.   Skin: Negative.   Allergic/Immunologic: Negative.   Neurological: Negative.   Hematological: Negative.   Psychiatric/Behavioral: Negative.      Today's Vitals   01/15/21 0847  BP: 110/62  Pulse: 83  Temp: 98.9 F (37.2 C)  TempSrc: Oral  Weight: 128 lb 9.6 oz (58.3 kg)  Height: $Remove'5\' 2"'nMsDoQR$  (1.575 m)   Body mass index is 23.52 kg/m.  Wt Readings from Last 3 Encounters:  01/15/21 128 lb 9.6 oz (58.3 kg)  08/06/20 139 lb (63 kg)  01/23/20 128 lb (58.1 kg)    Objective:  Physical Exam Vitals reviewed.  Constitutional:  General: She is not in acute distress.    Appearance: Normal appearance. She is well-developed.  HENT:     Head: Normocephalic and atraumatic.     Right Ear: Hearing, tympanic membrane, ear canal and external ear normal. There is no impacted cerumen.     Left Ear: Hearing, tympanic membrane, ear canal and external ear normal. There is no impacted cerumen.     Nose:     Comments: Deferred - mask     Mouth/Throat:     Comments: Deferred -  masked Eyes:     General: Lids are normal.     Extraocular Movements: Extraocular movements intact.     Conjunctiva/sclera: Conjunctivae normal.     Pupils: Pupils are equal, round, and reactive to light.     Funduscopic exam:    Right eye: No papilledema.        Left eye: No papilledema.  Neck:     Thyroid: No thyroid mass.     Vascular: No carotid bruit.  Cardiovascular:     Rate and Rhythm: Normal rate and regular rhythm.     Pulses: Normal pulses.     Heart sounds: Normal heart sounds. No murmur heard. Pulmonary:     Effort: Pulmonary effort is normal. No respiratory distress.     Breath sounds: Normal breath sounds. No wheezing.  Abdominal:     General: Abdomen is flat. Bowel sounds are normal. There is no distension.     Palpations: Abdomen is soft.     Tenderness: There is no abdominal tenderness.  Genitourinary:    Rectum: Guaiac result negative.  Musculoskeletal:        General: No swelling or tenderness. Normal range of motion.     Cervical back: Full passive range of motion without pain, normal range of motion and neck supple.     Right lower leg: No edema.     Left lower leg: No edema.  Skin:    General: Skin is warm and dry.     Capillary Refill: Capillary refill takes less than 2 seconds.  Neurological:     General: No focal deficit present.     Mental Status: She is alert and oriented to person, place, and time.     Cranial Nerves: No cranial nerve deficit.     Sensory: No sensory deficit.  Psychiatric:        Mood and Affect: Mood normal.        Behavior: Behavior normal.        Thought Content: Thought content normal.        Judgment: Judgment normal.        Assessment And Plan:     1. Health maintenance examination Behavior modifications discussed and diet history reviewed.   Pt will continue to exercise regularly and modify diet with low GI, plant based foods and decrease intake of processed foods.  Recommend intake of daily multivitamin, Vitamin  D, and calcium.  Recommend mammogram (up to date) for preventive screenings, as well as recommend immunizations that include influenza, TDAP (up to date)  - CBC  2. Vitamin D deficiency Will check vitamin D level and supplement as needed.    Also encouraged to spend 15 minutes in the sun daily.  - VITAMIN D 25 Hydroxy (Vit-D Deficiency, Fractures)  3. Elevated cholesterol Cholesterol levels are normal except for slightly elevated LDL at 101 Will check lipid panel - CMP14+EGFR - Lipid panel   Patient was given opportunity to ask questions. Patient verbalized  understanding of the plan and was able to repeat key elements of the plan. All questions were answered to their satisfaction.   Minette Brine, FNP   I, Minette Brine, FNP, have reviewed all documentation for this visit. The documentation on 01/15/21 for the exam, diagnosis, procedures, and orders are all accurate and complete.   THE PATIENT IS ENCOURAGED TO PRACTICE SOCIAL DISTANCING DUE TO THE COVID-19 PANDEMIC.

## 2021-01-16 LAB — CMP14+EGFR
ALT: 8 IU/L (ref 0–32)
AST: 21 IU/L (ref 0–40)
Albumin/Globulin Ratio: 1.7 (ref 1.2–2.2)
Albumin: 4.3 g/dL (ref 3.8–4.8)
Alkaline Phosphatase: 59 IU/L (ref 44–121)
BUN/Creatinine Ratio: 15 (ref 9–23)
BUN: 11 mg/dL (ref 6–24)
Bilirubin Total: 0.6 mg/dL (ref 0.0–1.2)
CO2: 23 mmol/L (ref 20–29)
Calcium: 8.9 mg/dL (ref 8.7–10.2)
Chloride: 107 mmol/L — ABNORMAL HIGH (ref 96–106)
Creatinine, Ser: 0.72 mg/dL (ref 0.57–1.00)
Globulin, Total: 2.5 g/dL (ref 1.5–4.5)
Glucose: 80 mg/dL (ref 65–99)
Potassium: 4.3 mmol/L (ref 3.5–5.2)
Sodium: 142 mmol/L (ref 134–144)
Total Protein: 6.8 g/dL (ref 6.0–8.5)
eGFR: 106 mL/min/{1.73_m2} (ref >59)

## 2021-01-16 LAB — CBC
Hematocrit: 38.2 % (ref 34.0–46.6)
Hemoglobin: 12.2 g/dL (ref 11.1–15.9)
MCH: 28.5 pg (ref 26.6–33.0)
MCHC: 31.9 g/dL (ref 31.5–35.7)
MCV: 89 fL (ref 79–97)
Platelets: 186 10*3/uL (ref 150–450)
RBC: 4.28 x10E6/uL (ref 3.77–5.28)
RDW: 12.9 % (ref 11.7–15.4)
WBC: 4.2 10*3/uL (ref 3.4–10.8)

## 2021-01-16 LAB — LIPID PANEL
Chol/HDL Ratio: 2.6 ratio (ref 0.0–4.4)
Cholesterol, Total: 143 mg/dL (ref 100–199)
HDL: 55 mg/dL (ref >39)
LDL Chol Calc (NIH): 75 mg/dL (ref 0–99)
Triglycerides: 62 mg/dL (ref 0–149)
VLDL Cholesterol Cal: 13 mg/dL (ref 5–40)

## 2021-01-16 LAB — VITAMIN D 25 HYDROXY (VIT D DEFICIENCY, FRACTURES): Vit D, 25-Hydroxy: 58.6 ng/mL (ref 30.0–100.0)

## 2021-07-25 ENCOUNTER — Encounter: Payer: Self-pay | Admitting: Plastic Surgery

## 2021-07-25 ENCOUNTER — Ambulatory Visit: Payer: Self-pay | Admitting: Plastic Surgery

## 2021-07-25 ENCOUNTER — Other Ambulatory Visit: Payer: Self-pay

## 2021-07-25 VITALS — BP 114/64 | HR 78 | Ht 61.0 in | Wt 127.4 lb

## 2021-07-25 DIAGNOSIS — Z411 Encounter for cosmetic surgery: Secondary | ICD-10-CM

## 2021-07-25 NOTE — Progress Notes (Addendum)
° °  Referring Provider Minette Brine, [place] [place] [address] [place],  [place] [id number]   CC:  Chief Complaint  Patient presents with   cosmetic visit  Breast augmentation   Rachel Reid is an 46 y.o. female.  HPI: Patient is a 46 year old who is interested in breast augmentation.  She wants a conservative result.  She is a non-smoker with no history of diabetes.  She has never had any breast disease and has no family history of breast cancer.  She is unsure about saline versus silicone.  She is undecided about incision but would most likely proceed with inframammary incision.  He had a normal mammogram in 2022 at [facility].  Allergies  Allergen Reactions   Sulfa Drugs Cross Reactors Hives    Outpatient Encounter Medications as of 07/25/2021  Medication Sig   Multiple Vitamin (MULTIVITAMIN WITH MINERALS) TABS tablet Take 1 tablet by mouth daily.   Vitamin D, Ergocalciferol, (DRISDOL) 1.25 MG (50000 UNIT) CAPS capsule Take 1 capsule (50,000 Units total) by mouth 2 (two) times a week.   No facility-administered encounter medications on file as of 07/25/2021.     Past Medical History:  Diagnosis Date   Anemia    hx   Atypical chest pain 01/23/2020   Endometriosis    Environmental allergies    Exertional dyspnea 01/23/2020   GERD (gastroesophageal reflux disease) 01/23/2020   Palpitations 01/23/2020   Seasonal allergies    SVD (spontaneous vaginal delivery)    x 1    Past Surgical History:  Procedure Laterality Date   BILATERAL SALPINGECTOMY Bilateral 01/15/2014   Procedure: BILATERAL SALPINGECTOMY;  Surgeon: Darlyn Chamber, MD;  Location: [facility];  Service: Gynecology;  Laterality: Bilateral;   CESAREAN SECTION     x 1   LAPAROSCOPIC ASSISTED VAGINAL HYSTERECTOMY N/A 01/15/2014   Procedure: LAPAROSCOPIC ASSISTED VAGINAL HYSTERECTOMY;  Surgeon: Darlyn Chamber, MD;  Location: [facility];  Service: Gynecology;  Laterality: N/A;   TUBAL LIGATION     lap tubal   WISDOM  TOOTH EXTRACTION      Family History  Problem Relation Age of Onset   Atrial fibrillation Father     Social history: No tobacco, no illicit drugs  Review of Systems General: Denies fevers, chills, weight loss CV: Denies chest pain, shortness of breath, palpitations   Physical Exam Vitals with BMI 07/25/2021 01/15/2021 08/06/2020  Height 5\' 1"  5\' 2"  5\' 1"   Weight 127 lbs 6 oz 128 lbs 10 oz 139 lbs  BMI 24.08 96.75 91.63  Systolic 846 659 935  Diastolic 64 62 68  Pulse 78 83 76    General:  No acute distress,  Alert and oriented, Non-Toxic, Normal speech and affect Breast: Patient has no breast ptosis.  No masses palpable on physical exam.  Sternal notch nipple is 19 cm bilaterally, base width is 11 cm bilaterally, nipple to fold is 7 cm bilaterally.  Assessment/Plan Patient is excellent candidate for bilateral breast augmentation.  She would be a good candidate for saline or silicone and we discussed these options.  We will most likely consider implants in the 225 to 325 cc range.  Loralye Loberg 07/25/2021, 3:00 PM

## 2021-08-02 IMAGING — CR DG CHEST 2V
1 series · 2 of 2 positions shown · non-contrast
Comparison: None available.

CLINICAL DATA: Initial evaluation for acute chest pain.

EXAM:
CHEST - 2 VIEW

[Series 1: dg chest 2 view · 0.14mm/px · 2 of 2 slices shown]
[im 1/2]
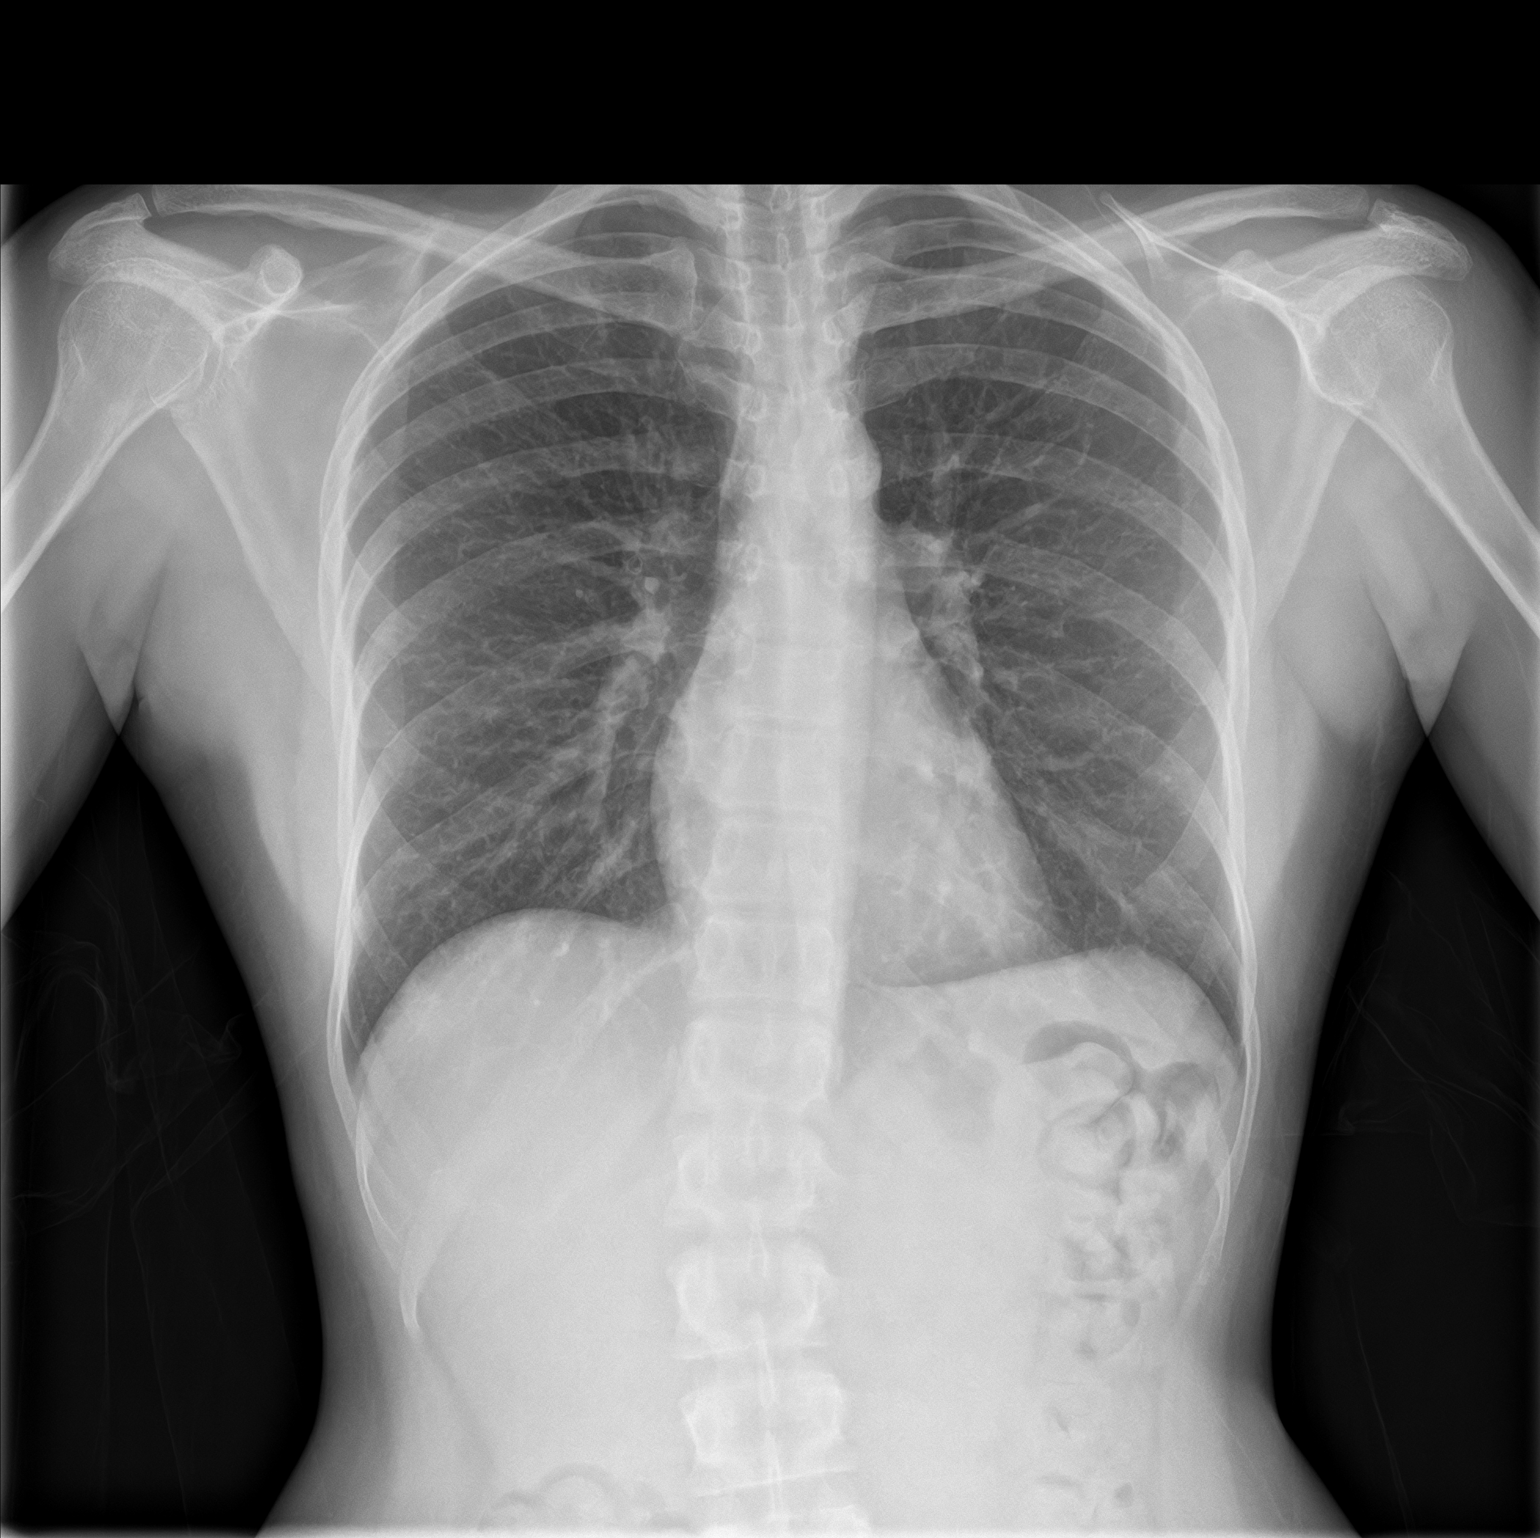
[im 2/2]
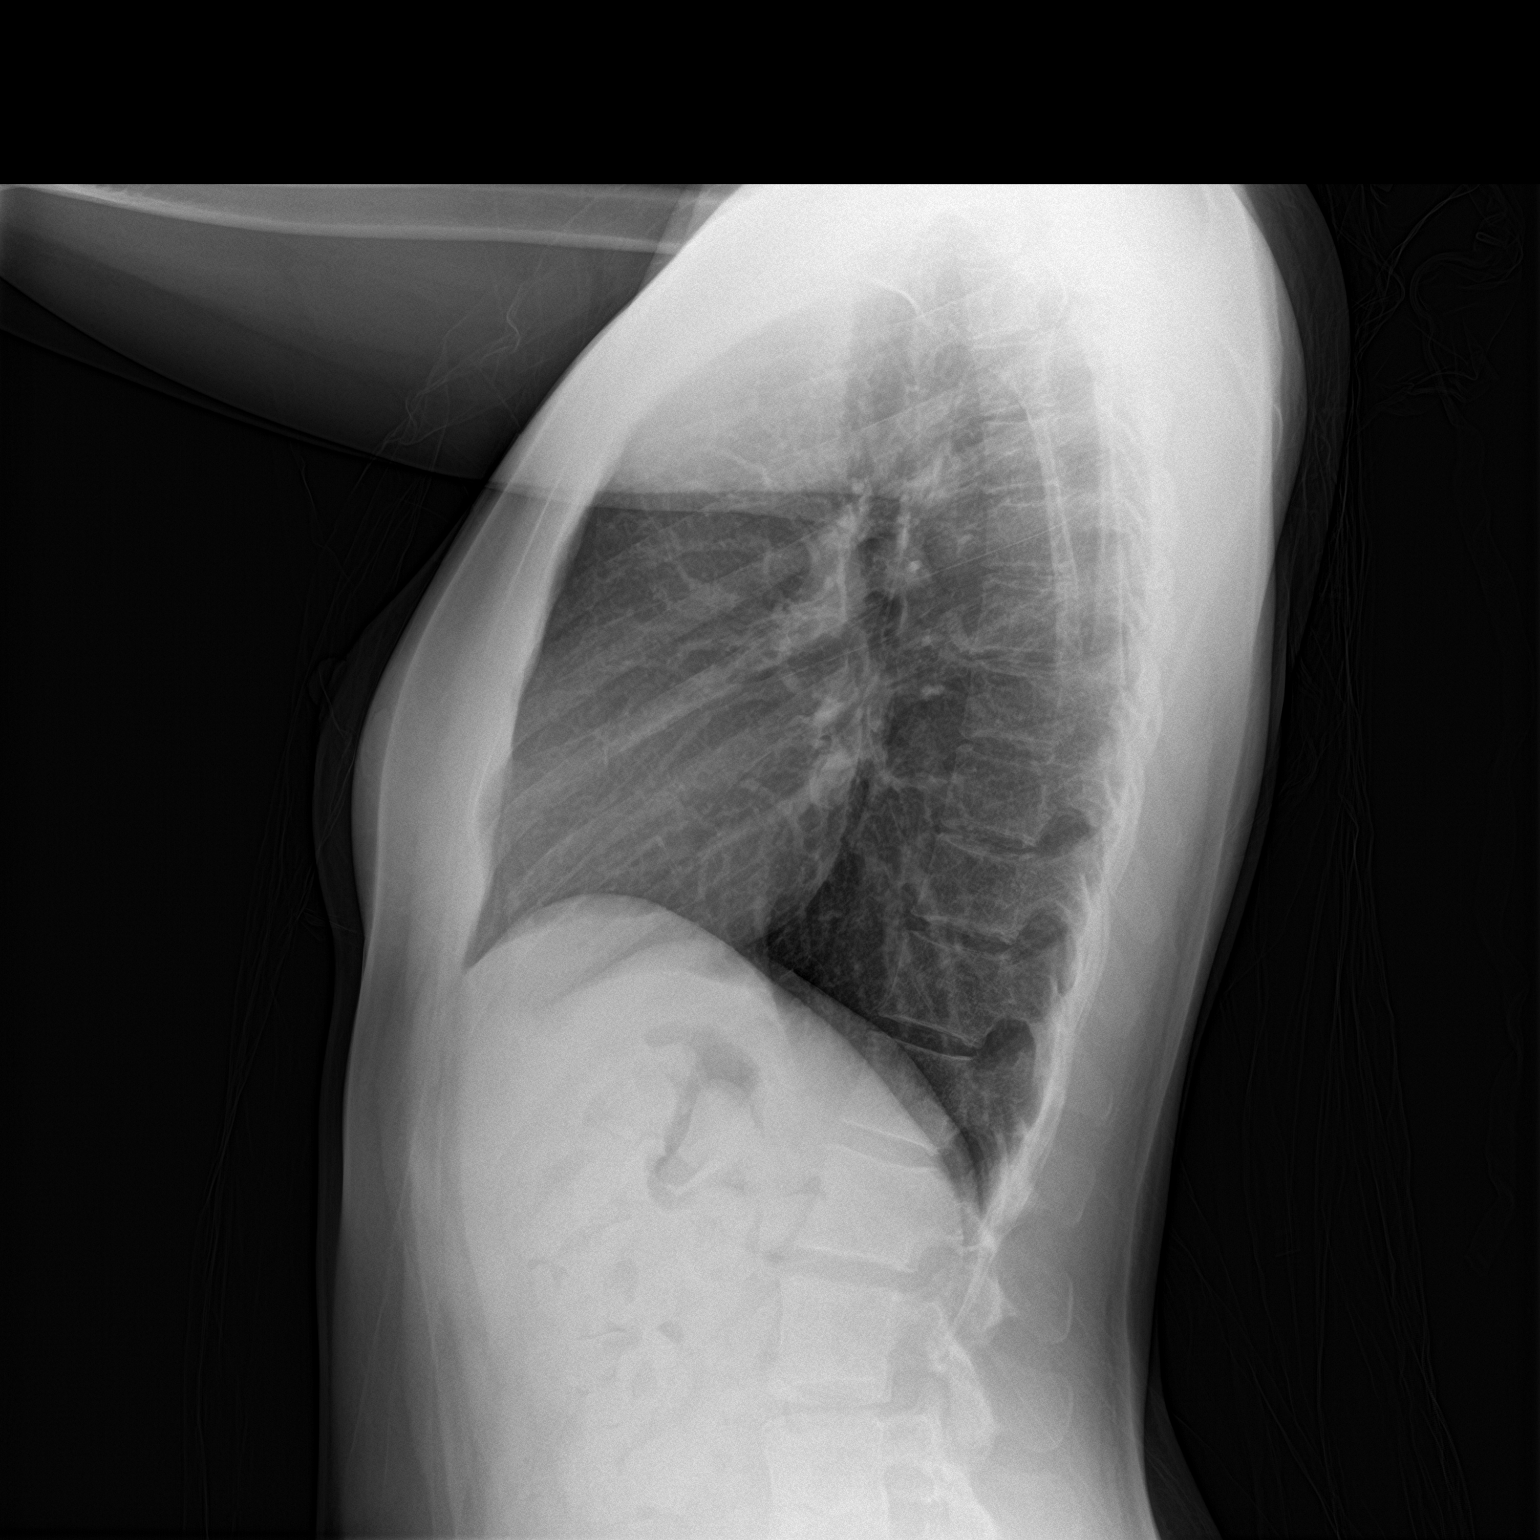

[2 of 2 positions shown; findings below may reference images not displayed]

FINDINGS: The cardiac and mediastinal silhouettes are within normal limits.

The lungs are normally inflated. No airspace consolidation, pleural
effusion, or pulmonary edema. No pneumothorax.

No acute osseous abnormality.  Thoracolumbar scoliosis noted.
IMPRESSION: No radiographic evidence for active cardiopulmonary disease.

## 2021-09-02 ENCOUNTER — Telehealth: Payer: Self-pay

## 2021-09-02 NOTE — Telephone Encounter (Signed)
Called patient, LMVM. Following up to inquire if she is ready to schedule surgery, and when with Dr. Erin Hearing. Call our office back. ?

## 2022-01-22 ENCOUNTER — Encounter: Payer: Self-pay | Admitting: Nurse Practitioner

## 2022-01-22 ENCOUNTER — Ambulatory Visit (INDEPENDENT_AMBULATORY_CARE_PROVIDER_SITE_OTHER): Payer: BC Managed Care – PPO | Admitting: Nurse Practitioner

## 2022-01-22 VITALS — BP 120/78 | HR 78 | Temp 98.3°F | Ht 61.0 in | Wt 130.0 lb

## 2022-01-22 DIAGNOSIS — E7800 Pure hypercholesterolemia, unspecified: Secondary | ICD-10-CM

## 2022-01-22 DIAGNOSIS — E559 Vitamin D deficiency, unspecified: Secondary | ICD-10-CM

## 2022-01-22 DIAGNOSIS — Z0000 Encounter for general adult medical examination without abnormal findings: Secondary | ICD-10-CM

## 2022-01-22 NOTE — Patient Instructions (Signed)

## 2022-01-22 NOTE — Progress Notes (Signed)
I,Tianna Badgett,acting as a Education administrator for [employer], FNP.,have documented all relevant documentation on the behalf of Minette Brine, FNP,as directed by  Minette Brine, FNP while in the presence of Minette Brine, [place]. Subjective:     Patient ID: Rachel Reid , female    DOB: [birth year] , 46 y.o.   MRN: [id number]   Chief Complaint  Patient presents with   Annual Exam    HPI  Here for HM.  She is followed by [facility] for her GYN care. No current issues or concerns     Past Medical History:  Diagnosis Date   Anemia    hx   Atypical chest pain 01/23/2020   Endometriosis    Environmental allergies    Exertional dyspnea 01/23/2020   GERD (gastroesophageal reflux disease) 01/23/2020   Palpitations 01/23/2020   Seasonal allergies    SVD (spontaneous vaginal delivery)    x 1     Family History  Problem Relation Age of Onset   Atrial fibrillation Father      Current Outpatient Medications:    Multiple Vitamin (MULTIVITAMIN WITH MINERALS) TABS tablet, Take 1 tablet by mouth daily., Disp: , Rfl:    Vitamin D, Ergocalciferol, (DRISDOL) 1.25 MG (50000 UNIT) CAPS capsule, Take 1 capsule (50,000 Units total) by mouth 2 (two) times a week., Disp: 24 capsule, Rfl: 1   Allergies  Allergen Reactions   Sulfa Drugs Cross Reactors Hives      The patient states she uses status post hysterectomy for birth control. Last LMP was Patient's last menstrual period was 01/05/2014.. Negative for Dysmenorrhea and Negative for Menorrhagia. Negative for: breast discharge, breast lump(s), breast pain and breast self exam. Associated symptoms include abnormal vaginal bleeding. Pertinent negatives include abnormal bleeding (hematology), anxiety, decreased libido, depression, difficulty falling sleep, dyspareunia, history of infertility, nocturia, sexual dysfunction, sleep disturbances, urinary incontinence, urinary urgency, vaginal discharge and vaginal itching. Diet regular, mostly healthy. The  patient states her exercise level is moderate with walking daily.   The patient's tobacco use is:  Social History   Tobacco Use  Smoking Status Never  Smokeless Tobacco Never   She has been exposed to passive smoke. The patient's alcohol use is:  Social History   Substance and Sexual Activity  Alcohol Use No    Review of Systems  Constitutional: Negative.   HENT: Negative.    Eyes: Negative.   Respiratory: Negative.    Cardiovascular: Negative.   Gastrointestinal: Negative.   Endocrine: Negative.   Genitourinary: Negative.   Musculoskeletal: Negative.   Skin: Negative.   Allergic/Immunologic: Negative.   Neurological: Negative.   Hematological: Negative.   Psychiatric/Behavioral: Negative.       Today's Vitals   01/22/22 0901  BP: 120/78  Pulse: 78  Temp: 98.3 F (36.8 C)  TempSrc: Oral  Weight: 130 lb (59 kg)  Height: _0  (1.549 m)   Body mass index is 24.56 kg/m.  Wt Readings from Last 3 Encounters:  01/22/22 130 lb (59 kg)  07/25/21 127 lb 6.4 oz (57.8 kg)  01/15/21 128 lb 9.6 oz (58.3 kg)    Objective:  Physical Exam Vitals reviewed.  Constitutional:      General: She is not in acute distress.    Appearance: Normal appearance. She is well-developed.  HENT:     Head: Normocephalic and atraumatic.     Right Ear: Hearing, tympanic membrane, ear canal and external ear normal. There is no impacted cerumen.     Left Ear:  Hearing, tympanic membrane, ear canal and external ear normal. There is no impacted cerumen.     Ears:     Comments: Has excess cerumen to left ear but not significant    Nose: Nose normal.     Mouth/Throat:     Mouth: Mucous membranes are moist.  Eyes:     General: Lids are normal.     Extraocular Movements: Extraocular movements intact.     Conjunctiva/sclera: Conjunctivae normal.     Pupils: Pupils are equal, round, and reactive to light.     Funduscopic exam:    Right eye: No papilledema.        Left eye: No papilledema.   Neck:     Thyroid: No thyroid mass.     Vascular: No carotid bruit.  Cardiovascular:     Rate and Rhythm: Normal rate and regular rhythm.     Pulses: Normal pulses.     Heart sounds: Normal heart sounds. No murmur heard. Pulmonary:     Effort: Pulmonary effort is normal. No respiratory distress.     Breath sounds: Normal breath sounds. No wheezing.  Abdominal:     General: Abdomen is flat. Bowel sounds are normal. There is no distension.     Palpations: Abdomen is soft.     Tenderness: There is no abdominal tenderness.  Musculoskeletal:        General: No swelling or tenderness. Normal range of motion.     Cervical back: Full passive range of motion without pain, normal range of motion and neck supple.     Right lower leg: No edema.     Left lower leg: No edema.  Skin:    General: Skin is warm and dry.     Capillary Refill: Capillary refill takes less than 2 seconds.  Neurological:     General: No focal deficit present.     Mental Status: She is alert and oriented to person, place, and time.     Cranial Nerves: No cranial nerve deficit.     Sensory: No sensory deficit.     Motor: No weakness.  Psychiatric:        Mood and Affect: Mood normal.        Behavior: Behavior normal.        Thought Content: Thought content normal.        Judgment: Judgment normal.         Assessment And Plan:     1. Health maintenance examination Behavior modifications discussed and diet history reviewed.   Pt will continue to exercise regularly and modify diet with low GI, plant based foods and decrease intake of processed foods.  Recommend intake of daily multivitamin, Vitamin D, and calcium.  Recommend mammogram for preventive screenings, as well as recommend immunizations that include influenza, TDAP (up to date) - CBC  2. Vitamin D deficiency Comments: Levels were normal at last visit, continue vitamin d supplement and sunlight at least 10-15 minutes a day - VITAMIN D 25 Hydroxy (Vit-D  Deficiency, Fractures)  3. Elevated cholesterol Comments: Improved at last visit. Continue low fat diet.  - CMP14+EGFR - Lipid panel   Patient was given opportunity to ask questions. Patient verbalized understanding of the plan and was able to repeat key elements of the plan. All questions were answered to their satisfaction.   Minette Brine, FNP   I, Minette Brine, FNP, have reviewed all documentation for this visit. The documentation on 01/22/22 for the exam, diagnosis, procedures, and orders are all  accurate and complete.   THE PATIENT IS ENCOURAGED TO PRACTICE SOCIAL DISTANCING DUE TO THE COVID-19 PANDEMIC.

## 2022-01-23 LAB — CBC
Hematocrit: 40.5 % (ref 34.0–46.6)
Hemoglobin: 12.8 g/dL (ref 11.1–15.9)
MCH: 27.8 pg (ref 26.6–33.0)
MCHC: 31.6 g/dL (ref 31.5–35.7)
MCV: 88 fL (ref 79–97)
Platelets: 224 10*3/uL (ref 150–450)
RBC: 4.6 x10E6/uL (ref 3.77–5.28)
RDW: 12.6 % (ref 11.7–15.4)
WBC: 2.9 10*3/uL — ABNORMAL LOW (ref 3.4–10.8)

## 2022-01-23 LAB — CMP14+EGFR
ALT: 11 IU/L (ref 0–32)
AST: 28 IU/L (ref 0–40)
Albumin/Globulin Ratio: 1.6 (ref 1.2–2.2)
Albumin: 4.5 g/dL (ref 3.9–4.9)
Alkaline Phosphatase: 64 IU/L (ref 44–121)
BUN/Creatinine Ratio: 14 (ref 9–23)
BUN: 10 mg/dL (ref 6–24)
Bilirubin Total: 0.6 mg/dL (ref 0.0–1.2)
CO2: 22 mmol/L (ref 20–29)
Calcium: 9 mg/dL (ref 8.7–10.2)
Chloride: 103 mmol/L (ref 96–106)
Creatinine, Ser: 0.7 mg/dL (ref 0.57–1.00)
Globulin, Total: 2.8 g/dL (ref 1.5–4.5)
Glucose: 95 mg/dL (ref 70–99)
Potassium: 4.2 mmol/L (ref 3.5–5.2)
Sodium: 138 mmol/L (ref 134–144)
Total Protein: 7.3 g/dL (ref 6.0–8.5)
eGFR: 109 mL/min/{1.73_m2} (ref >59)

## 2022-01-23 LAB — LIPID PANEL
Chol/HDL Ratio: 2.8 ratio (ref 0.0–4.4)
Cholesterol, Total: 175 mg/dL (ref 100–199)
HDL: 62 mg/dL (ref >39)
LDL Chol Calc (NIH): 104 mg/dL — ABNORMAL HIGH (ref 0–99)
Triglycerides: 43 mg/dL (ref 0–149)
VLDL Cholesterol Cal: 9 mg/dL (ref 5–40)

## 2022-01-23 LAB — VITAMIN D 25 HYDROXY (VIT D DEFICIENCY, FRACTURES): Vit D, 25-Hydroxy: 20.7 ng/mL — ABNORMAL LOW (ref 30.0–100.0)

## 2022-01-25 ENCOUNTER — Other Ambulatory Visit: Payer: Self-pay | Admitting: Nurse Practitioner

## 2022-01-26 MED ORDER — ADULT MULTIVITAMIN W/MINERALS CH: 1.0000 | ORAL_TABLET | Freq: Every day | ORAL | 2 refills | Status: DC

## 2022-01-29 MED ORDER — ADULT MULTIVITAMIN W/MINERALS CH: 1.0000 | ORAL_TABLET | Freq: Every day | ORAL | 1 refills | Status: AC

## 2022-02-01 MED ORDER — VITAMIN D (ERGOCALCIFEROL) 1.25 MG (50000 UNIT) PO CAPS: 50000.0000 [IU] | ORAL_CAPSULE | ORAL | 1 refills | Status: DC

## 2022-02-24 ENCOUNTER — Ambulatory Visit: Payer: BC Managed Care – PPO

## 2022-08-26 ENCOUNTER — Other Ambulatory Visit: Payer: Self-pay | Admitting: Nurse Practitioner

## 2022-08-26 DIAGNOSIS — E559 Vitamin D deficiency, unspecified: Secondary | ICD-10-CM

## 2023-01-27 ENCOUNTER — Ambulatory Visit (INDEPENDENT_AMBULATORY_CARE_PROVIDER_SITE_OTHER): Payer: BC Managed Care – PPO | Admitting: Nurse Practitioner

## 2023-01-27 ENCOUNTER — Encounter: Payer: Self-pay | Admitting: Nurse Practitioner

## 2023-01-27 VITALS — BP 110/82 | HR 92 | Temp 98.5°F | Ht 61.0 in | Wt 155.8 lb

## 2023-01-27 DIAGNOSIS — Z0000 Encounter for general adult medical examination without abnormal findings: Secondary | ICD-10-CM | POA: Diagnosis not present

## 2023-01-27 DIAGNOSIS — Z1211 Encounter for screening for malignant neoplasm of colon: Secondary | ICD-10-CM

## 2023-01-27 DIAGNOSIS — K219 Gastro-esophageal reflux disease without esophagitis: Secondary | ICD-10-CM | POA: Diagnosis not present

## 2023-01-27 DIAGNOSIS — E559 Vitamin D deficiency, unspecified: Secondary | ICD-10-CM

## 2023-01-27 DIAGNOSIS — E7800 Pure hypercholesterolemia, unspecified: Secondary | ICD-10-CM | POA: Diagnosis not present

## 2023-01-27 DIAGNOSIS — Z92241 Personal history of systemic steroid therapy: Secondary | ICD-10-CM

## 2023-01-27 DIAGNOSIS — Z79899 Other long term (current) drug therapy: Secondary | ICD-10-CM

## 2023-01-27 NOTE — Patient Instructions (Signed)

## 2023-01-27 NOTE — Progress Notes (Signed)
Madelaine Bhat, CMA,acting as a Neurosurgeon for Arnette Felts, FNP.,have documented all relevant documentation on the behalf of Arnette Felts, FNP,as directed by  Arnette Felts, FNP while in the presence of Arnette Felts, FNP.  Subjective:    Patient ID: Rachel Reid , female    DOB: [birth year] , 47 y.o.   MRN: [id number]  Chief Complaint  Patient presents with   Annual Exam    HPI  Patient presents today for annual exam, patient repots compliance with medications. Patient denies any chest pain, SOB, or headaches. Patient has no other concerns today. GYN: Dr Arelia Sneddon. She has been on steroids due to hives, taken times 2. She is seeing an allergist in [facility], she has been treated for urticaria, Xolair injections and antihistamines.   She admits not yet completing colonoscopy.   Wt Readings from Last 3 Encounters: 01/27/23 : 155 lb 12.8 oz (70.7 kg) 01/22/22 : 130 lb (59 kg) 07/25/21 : 127 lb 6.4 oz (57.8 kg)       Past Medical History:  Diagnosis Date   Anemia    hx   Atypical chest pain 01/23/2020   Endometriosis    Environmental allergies    Exertional dyspnea 01/23/2020   GERD (gastroesophageal reflux disease) 01/23/2020   Palpitations 01/23/2020   Seasonal allergies    SVD (spontaneous vaginal delivery)    x 1     Family History  Problem Relation Age of Onset   Atrial fibrillation Father      Current Outpatient Medications:    Multiple Vitamin (MULTIVITAMIN WITH MINERALS) TABS tablet, Take 1 tablet by mouth daily., Disp: 90 tablet, Rfl: 1   Vitamin D, Ergocalciferol, (DRISDOL) 1.25 MG (50000 UNIT) CAPS capsule, TAKE 1 CAPSULE (50,000 UNITS TOTAL) BY MOUTH TWO TIMES A WEEK, Disp: 24 capsule, Rfl: 1   Allergies  Allergen Reactions   Sulfa Drugs Cross Reactors Hives      The patient states she uses status post hysterectomy for birth control. Patient's last menstrual period was 01/05/2014.  Negative for: breast discharge, breast lump(s), breast pain and breast self  exam. Associated symptoms include abnormal vaginal bleeding. Pertinent negatives include abnormal bleeding (hematology), anxiety, decreased libido, depression, difficulty falling sleep, dyspareunia, history of infertility, nocturia, sexual dysfunction, sleep disturbances, urinary incontinence, urinary urgency, vaginal discharge and vaginal itching. Diet regular. The patient states her exercise level is moderate with walking 3 times a week for about 2 miles.   The patient's tobacco use is:  Social History   Tobacco Use  Smoking Status Never  Smokeless Tobacco Never  . She has been exposed to passive smoke. The patient's alcohol use is:  Social History   Substance and Sexual Activity  Alcohol Use No  Additional information: Last pap 10/02/2021, next one scheduled for 10/02/2024.    Review of Systems  Constitutional: Negative.   HENT: Negative.    Eyes: Negative.   Respiratory: Negative.    Cardiovascular: Negative.   Gastrointestinal: Negative.   Endocrine: Negative.   Genitourinary: Negative.   Musculoskeletal: Negative.   Skin: Negative.   Allergic/Immunologic: Negative.   Neurological: Negative.   Hematological: Negative.   Psychiatric/Behavioral: Negative.       Today's Vitals   01/27/23 0919  BP: 110/82  Pulse: 92  Temp: 98.5 F (36.9 C)  SpO2: 98%  Weight: 155 lb 12.8 oz (70.7 kg)  Height: 5\' 1"  (1.549 m)   Body mass index is 29.44 kg/m.  Wt Readings from Last 3 Encounters:  01/27/23 155  lb 12.8 oz (70.7 kg)  01/22/22 130 lb (59 kg)  07/25/21 127 lb 6.4 oz (57.8 kg)     Objective:  Physical Exam Vitals reviewed.  Constitutional:      General: She is not in acute distress.    Appearance: Normal appearance. She is well-developed.  HENT:     Head: Normocephalic and atraumatic.     Right Ear: Hearing, tympanic membrane, ear canal and external ear normal. There is no impacted cerumen.     Left Ear: Hearing, tympanic membrane, ear canal and external ear normal.  There is no impacted cerumen.     Nose: Nose normal.     Mouth/Throat:     Mouth: Mucous membranes are moist.  Eyes:     General: Lids are normal.     Extraocular Movements: Extraocular movements intact.     Conjunctiva/sclera: Conjunctivae normal.     Pupils: Pupils are equal, round, and reactive to light.     Funduscopic exam:    Right eye: No papilledema.        Left eye: No papilledema.  Neck:     Thyroid: No thyroid mass.     Vascular: No carotid bruit.  Cardiovascular:     Rate and Rhythm: Normal rate and regular rhythm.     Pulses: Normal pulses.     Heart sounds: Normal heart sounds. No murmur heard. Pulmonary:     Effort: Pulmonary effort is normal. No respiratory distress.     Breath sounds: Normal breath sounds. No wheezing.  Abdominal:     General: Abdomen is flat. Bowel sounds are normal. There is no distension.     Palpations: Abdomen is soft.     Tenderness: There is no abdominal tenderness.  Genitourinary:    Comments: Deferred - followed by GYN Musculoskeletal:        General: No swelling or tenderness. Normal range of motion.     Cervical back: Full passive range of motion without pain, normal range of motion and neck supple.     Right lower leg: No edema.     Left lower leg: No edema.  Skin:    General: Skin is warm and dry.     Capillary Refill: Capillary refill takes less than 2 seconds.  Neurological:     General: No focal deficit present.     Mental Status: She is alert and oriented to person, place, and time.     Cranial Nerves: No cranial nerve deficit.     Sensory: No sensory deficit.     Motor: No weakness.  Psychiatric:        Mood and Affect: Mood normal.        Behavior: Behavior normal.        Thought Content: Thought content normal.        Judgment: Judgment normal.         Assessment And Plan:     Encounter for annual health examination Assessment & Plan: Behavior modifications discussed and diet history reviewed.   Pt will  continue to exercise regularly and modify diet with low GI, plant based foods and decrease intake of processed foods.  Recommend intake of daily multivitamin, Vitamin D, and calcium.  Recommend mammogram and colonoscopy/cologuard for preventive screenings, as well as recommend immunizations that include influenza, TDAP    Screening for colon cancer Assessment & Plan: Will send order for cologuard, denies family history of colon cancer.   Orders: -     Cologuard  Vitamin D  deficiency Assessment & Plan: Will check vitamin D level and supplement as needed.    Also encouraged to spend 15 minutes in the sun daily.    Orders: -     VITAMIN D 25 Hydroxy (Vit-D Deficiency, Fractures)  Elevated cholesterol Assessment & Plan: LDL levels were slightly elevated at last visit, will recheck lipid panel today  Orders: -     Lipid panel  Gastroesophageal reflux disease, unspecified whether esophagitis present Assessment & Plan: Stable, encouraged to continue limiting intake of high fatty and spicy foods   History of recent steroid use Assessment & Plan: She is being treated for urticaria with frequent steroids  Orders: -     CMP14+EGFR -     Hemoglobin A1c  Other long term (current) drug therapy -     CBC with Differential/Platelet    Return for 1 year HM. Patient was given opportunity to ask questions. Patient verbalized understanding of the plan and was able to repeat key elements of the plan. All questions were answered to their satisfaction.   Arnette Felts, FNP  I, Arnette Felts, FNP, have reviewed all documentation for this visit. The documentation on 01/27/23 for the exam, diagnosis, procedures, and orders are all accurate and complete.

## 2023-01-28 LAB — CBC WITH DIFFERENTIAL/PLATELET
Basophils Absolute: 0 10*3/uL (ref 0.0–0.2)
Basos: 0 %
EOS (ABSOLUTE): 0.1 10*3/uL (ref 0.0–0.4)
Eos: 4 %
Hematocrit: 39.2 % (ref 34.0–46.6)
Hemoglobin: 12.7 g/dL (ref 11.1–15.9)
Immature Grans (Abs): 0 10*3/uL (ref 0.0–0.1)
Immature Granulocytes: 0 %
Lymphocytes Absolute: 1 10*3/uL (ref 0.7–3.1)
Lymphs: 32 %
MCH: 28.2 pg (ref 26.6–33.0)
MCHC: 32.4 g/dL (ref 31.5–35.7)
MCV: 87 fL (ref 79–97)
Monocytes Absolute: 0.4 10*3/uL (ref 0.1–0.9)
Monocytes: 12 %
Neutrophils Absolute: 1.6 10*3/uL (ref 1.4–7.0)
Neutrophils: 52 %
Platelets: 180 10*3/uL (ref 150–450)
RBC: 4.5 x10E6/uL (ref 3.77–5.28)
RDW: 14.1 % (ref 11.7–15.4)
WBC: 3.1 10*3/uL — ABNORMAL LOW (ref 3.4–10.8)

## 2023-01-28 LAB — CMP14+EGFR
ALT: 14 IU/L (ref 0–32)
AST: 28 IU/L (ref 0–40)
Albumin: 4 g/dL (ref 3.9–4.9)
Alkaline Phosphatase: 64 IU/L (ref 44–121)
BUN/Creatinine Ratio: 13 (ref 9–23)
BUN: 9 mg/dL (ref 6–24)
Bilirubin Total: 0.7 mg/dL (ref 0.0–1.2)
CO2: 24 mmol/L (ref 20–29)
Calcium: 9 mg/dL (ref 8.7–10.2)
Chloride: 105 mmol/L (ref 96–106)
Creatinine, Ser: 0.7 mg/dL (ref 0.57–1.00)
Globulin, Total: 2.7 g/dL (ref 1.5–4.5)
Glucose: 77 mg/dL (ref 70–99)
Potassium: 4.5 mmol/L (ref 3.5–5.2)
Sodium: 140 mmol/L (ref 134–144)
Total Protein: 6.7 g/dL (ref 6.0–8.5)
eGFR: 109 mL/min/{1.73_m2} (ref >59)

## 2023-01-28 LAB — LIPID PANEL
Chol/HDL Ratio: 2.8 ratio (ref 0.0–4.4)
Cholesterol, Total: 171 mg/dL (ref 100–199)
HDL: 62 mg/dL (ref >39)
LDL Chol Calc (NIH): 91 mg/dL (ref 0–99)
Triglycerides: 99 mg/dL (ref 0–149)
VLDL Cholesterol Cal: 18 mg/dL (ref 5–40)

## 2023-01-28 LAB — VITAMIN D 25 HYDROXY (VIT D DEFICIENCY, FRACTURES): Vit D, 25-Hydroxy: 32.6 ng/mL (ref 30.0–100.0)

## 2023-01-28 LAB — HEMOGLOBIN A1C
Est. average glucose Bld gHb Est-mCnc: 105 mg/dL
Hgb A1c MFr Bld: 5.3 % (ref 4.8–5.6)

## 2023-02-03 DIAGNOSIS — Z92241 Personal history of systemic steroid therapy: Secondary | ICD-10-CM | POA: Insufficient documentation

## 2023-02-03 DIAGNOSIS — Z1211 Encounter for screening for malignant neoplasm of colon: Secondary | ICD-10-CM | POA: Insufficient documentation

## 2023-02-03 DIAGNOSIS — Z0000 Encounter for general adult medical examination without abnormal findings: Secondary | ICD-10-CM | POA: Insufficient documentation

## 2023-02-03 DIAGNOSIS — E7800 Pure hypercholesterolemia, unspecified: Secondary | ICD-10-CM | POA: Insufficient documentation

## 2023-02-03 NOTE — Assessment & Plan Note (Signed)
LDL levels were slightly elevated at last visit, will recheck lipid panel today

## 2023-02-03 NOTE — Assessment & Plan Note (Signed)
Will check vitamin D level and supplement as needed.    Also encouraged to spend 15 minutes in the sun daily.   

## 2023-02-03 NOTE — Assessment & Plan Note (Signed)
Stable, encouraged to continue limiting intake of high fatty and spicy foods

## 2023-02-03 NOTE — Assessment & Plan Note (Signed)
She is being treated for urticaria with frequent steroids

## 2023-02-03 NOTE — Assessment & Plan Note (Signed)
Behavior modifications discussed and diet history reviewed.   Pt will continue to exercise regularly and modify diet with low GI, plant based foods and decrease intake of processed foods.  Recommend intake of daily multivitamin, Vitamin D, and calcium.  Recommend mammogram and colonoscopy/cologuard for preventive screenings, as well as recommend immunizations that include influenza, TDAP

## 2023-02-03 NOTE — Assessment & Plan Note (Signed)
Will send order for cologuard, denies family history of colon cancer.

## 2023-02-05 LAB — COLOGUARD

## 2023-02-22 LAB — COLOGUARD: COLOGUARD: NEGATIVE

## 2023-10-12 ENCOUNTER — Ambulatory Visit (INDEPENDENT_AMBULATORY_CARE_PROVIDER_SITE_OTHER)

## 2023-10-12 ENCOUNTER — Ambulatory Visit
Admission: RE | Admit: 2023-10-12 | Discharge: 2023-10-12 | Disposition: A | Discharge status: Home / Self Care | Source: Ambulatory Visit

## 2023-10-12 VITALS — BP 124/77 | HR 76 | Temp 97.8°F | Resp 18

## 2023-10-12 DIAGNOSIS — R0609 Other forms of dyspnea: Secondary | ICD-10-CM

## 2023-10-12 DIAGNOSIS — R5383 Other fatigue: Secondary | ICD-10-CM

## 2023-10-12 NOTE — ED Triage Notes (Signed)
 Patient to Urgent Care with complaints of low energy/ fatigue/ headaches/ chills.   Symptoms started Friday. Reports that she had some diarrhea throughout the night into Saturday. Followed by "low grade" fevers on Saturday. Developed headaches yesterday.   Taking ibuprofen (last dose 12pm yesterday).

## 2023-10-12 NOTE — ED Provider Notes (Signed)
 [facility] [place]  Note:  This document was prepared using Conservation officer, historic buildings and may include unintentional dictation errors.  MRN: [id number] DOB: [birth year]  Subjective:   Rachel Reid is a 47 y.o. female presenting for fatigue, headaches, intermittent chills since Friday.  Patient reports a temperature of around 100 on Saturday evening as well as some diarrhea.  Patient developed headache yesterday.  Has been taking ibuprofen for pain relief with mild improvement.  Last dose of ibuprofen was 12 PM yesterday.  Patient also had intermittent dyspnea with exertion and chest pain in the middle of her chest yesterday that all resolved.  Patient was concern for possible cardiac or stroke which caused her to come to urgent care for evaluation.  Patient denies any current chest pain, shortness of breath, weakness, dizziness.  No current facility-administered medications for this encounter.  Current Outpatient Medications:    Vitamin D , Ergocalciferol , (DRISDOL ) 1.25 MG (50000 UNIT) CAPS capsule, Take 50,000 Units by mouth., Disp: , Rfl:    desloratadine (CLARINEX) 5 MG tablet, Take 10 mg by mouth., Disp: , Rfl:    famotidine  (PEPCID ) 20 MG tablet, Take 20 mg by mouth 2 (two) times daily., Disp: , Rfl:    folic acid (FOLVITE) 1 MG tablet, Take 1 mg by mouth daily., Disp: , Rfl:    hydroxychloroquine (PLAQUENIL) 200 MG tablet, Take 300 mg by mouth daily. (Patient not taking: Reported on 10/12/2023), Disp: , Rfl:    methotrexate (RHEUMATREX) 2.5 MG tablet, SMARTSIG:5 Tablet(s) By Mouth Once a Week, Disp: , Rfl:    Multiple Vitamin (MULTIVITAMIN WITH MINERALS) TABS tablet, Take 1 tablet by mouth daily., Disp: 90 tablet, Rfl: 1   Vitamin D , Ergocalciferol , (DRISDOL ) 1.25 MG (50000 UNIT) CAPS capsule, TAKE 1 CAPSULE (50,000 UNITS TOTAL) BY MOUTH TWO TIMES A WEEK, Disp: 24 capsule, Rfl: 1   Allergies  Allergen Reactions   Sulfa Drugs Cross Reactors Hives    Past Medical  History:  Diagnosis Date   Anemia    hx   Atypical chest pain 01/23/2020   Endometriosis    Environmental allergies    Exertional dyspnea 01/23/2020   GERD (gastroesophageal reflux disease) 01/23/2020   Palpitations 01/23/2020   Seasonal allergies    SVD (spontaneous vaginal delivery)    x 1     Past Surgical History:  Procedure Laterality Date   BILATERAL SALPINGECTOMY Bilateral 01/15/2014   Procedure: BILATERAL SALPINGECTOMY;  Surgeon: Chandler Combs, MD;  Location: [facility];  Service: Gynecology;  Laterality: Bilateral;   CESAREAN SECTION     x 1   LAPAROSCOPIC ASSISTED VAGINAL HYSTERECTOMY N/A 01/15/2014   Procedure: LAPAROSCOPIC ASSISTED VAGINAL HYSTERECTOMY;  Surgeon: Chandler Combs, MD;  Location: [facility];  Service: Gynecology;  Laterality: N/A;   TUBAL LIGATION     lap tubal   WISDOM TOOTH EXTRACTION      Family History  Problem Relation Age of Onset   Atrial fibrillation Father     Social History   Tobacco Use   Smoking status: Never   Smokeless tobacco: Never  Substance Use Topics   Alcohol use: No   Drug use: No    ROS Refer to HPI for ROS details.  Objective:   Vitals: BP 124/77   Pulse 76   Temp 97.8 F (36.6 C)   Resp 18   LMP 01/05/2014   SpO2 98%   Physical Exam Vitals and nursing note reviewed.  Constitutional:      General: She is not  in acute distress.    Appearance: Normal appearance. She is well-developed. She is not ill-appearing or toxic-appearing.  HENT:     Head: Normocephalic and atraumatic.     Nose: Nose normal. No congestion or rhinorrhea.     Mouth/Throat:     Mouth: Mucous membranes are moist.  Eyes:     General:        Right eye: No discharge.        Left eye: No discharge.     Extraocular Movements: Extraocular movements intact.     Conjunctiva/sclera: Conjunctivae normal.  Cardiovascular:     Rate and Rhythm: Normal rate and regular rhythm.     Heart sounds: Normal heart sounds. No murmur heard. Pulmonary:     Effort:  Pulmonary effort is normal. No respiratory distress.     Breath sounds: Normal breath sounds. No stridor. No wheezing, rhonchi or rales.  Chest:     Chest wall: No tenderness.  Skin:    General: Skin is warm and dry.  Neurological:     General: No focal deficit present.     Mental Status: She is alert and oriented to person, place, and time.     Cranial Nerves: No cranial nerve deficit.     Sensory: No sensory deficit.     Motor: No weakness.     Gait: Gait normal.  Psychiatric:        Mood and Affect: Mood normal.        Behavior: Behavior normal.     Procedures  No results found for this or any previous visit (from the past 24 hours).  No results found.   Assessment and Plan :     Discharge Instructions       1. Dyspnea on exertion (Primary) - ED EKG performed in UC shows normal sinus rhythm with a ventricular rate of 71 bpm, no STEMI.  Normal EKG. - DG Chest 2 View x-ray performed in UC shows no acute cardiopulmonary processes, no sign of consolidation or pneumonia, normal chest x-ray.  2. Fatigue, unspecified type - CBC with Differential/Platelet to evaluate for elevated white blood count or low hemoglobin as cause of fatigue. - Comprehensive metabolic panel to evaluate liver and kidney function as well as electrolytes - TSH to evaluate thyroid  function -Continue to monitor symptoms for any change in severity if there is any escalation of current symptoms or development of new symptoms follow-up in ER for further evaluation and management.      Allyah Heather B Imajean Mcdermid   Jasime Westergren, [place], [place] 10/12/23 1158

## 2023-10-12 NOTE — Discharge Instructions (Addendum)
  1. Dyspnea on exertion (Primary) - ED EKG performed in UC shows normal sinus rhythm with a ventricular rate of 71 bpm, no STEMI.  Normal EKG. - DG Chest 2 View x-ray performed in UC shows no acute cardiopulmonary processes, no sign of consolidation or pneumonia, normal chest x-ray.  2. Fatigue, unspecified type - CBC with Differential/Platelet to evaluate for elevated white blood count or low hemoglobin as cause of fatigue. - Comprehensive metabolic panel to evaluate liver and kidney function as well as electrolytes - TSH to evaluate thyroid  function -Continue to monitor symptoms for any change in severity if there is any escalation of current symptoms or development of new symptoms follow-up in ER for further evaluation and management.

## 2023-10-13 LAB — CBC WITH DIFFERENTIAL/PLATELET
Basophils Absolute: 0 10*3/uL (ref 0.0–0.2)
Basos: 1 %
EOS (ABSOLUTE): 0.1 10*3/uL (ref 0.0–0.4)
Eos: 3 %
Hematocrit: 43.6 % (ref 34.0–46.6)
Hemoglobin: 14.1 g/dL (ref 11.1–15.9)
Immature Grans (Abs): 0 10*3/uL (ref 0.0–0.1)
Immature Granulocytes: 0 %
Lymphocytes Absolute: 1.1 10*3/uL (ref 0.7–3.1)
Lymphs: 32 %
MCH: 28.4 pg (ref 26.6–33.0)
MCHC: 32.3 g/dL (ref 31.5–35.7)
MCV: 88 fL (ref 79–97)
Monocytes Absolute: 0.4 10*3/uL (ref 0.1–0.9)
Monocytes: 11 %
Neutrophils Absolute: 1.8 10*3/uL (ref 1.4–7.0)
Neutrophils: 53 %
Platelets: 239 10*3/uL (ref 150–450)
RBC: 4.96 x10E6/uL (ref 3.77–5.28)
RDW: 13.4 % (ref 11.7–15.4)
WBC: 3.3 10*3/uL — ABNORMAL LOW (ref 3.4–10.8)

## 2023-10-13 LAB — COMPREHENSIVE METABOLIC PANEL WITH GFR
ALT: 13 IU/L (ref 0–32)
AST: 31 IU/L (ref 0–40)
Albumin: 4.4 g/dL (ref 3.9–4.9)
Alkaline Phosphatase: 71 IU/L (ref 44–121)
BUN/Creatinine Ratio: 13 (ref 9–23)
BUN: 10 mg/dL (ref 6–24)
Bilirubin Total: 0.7 mg/dL (ref 0.0–1.2)
CO2: 23 mmol/L (ref 20–29)
Calcium: 9.3 mg/dL (ref 8.7–10.2)
Chloride: 102 mmol/L (ref 96–106)
Creatinine, Ser: 0.76 mg/dL (ref 0.57–1.00)
Globulin, Total: 2.8 g/dL (ref 1.5–4.5)
Glucose: 89 mg/dL (ref 70–99)
Potassium: 3.7 mmol/L (ref 3.5–5.2)
Sodium: 141 mmol/L (ref 134–144)
Total Protein: 7.2 g/dL (ref 6.0–8.5)
eGFR: 98 mL/min/{1.73_m2} (ref >59)

## 2023-10-13 LAB — TSH: TSH: 1.26 u[IU]/mL (ref 0.450–4.500)

## 2024-01-31 ENCOUNTER — Ambulatory Visit: Payer: BC Managed Care – PPO | Admitting: Nurse Practitioner

## 2024-01-31 ENCOUNTER — Encounter: Payer: Self-pay | Admitting: Nurse Practitioner

## 2024-01-31 VITALS — BP 116/80 | HR 74 | Temp 98.2°F | Ht 61.0 in | Wt 160.0 lb

## 2024-01-31 DIAGNOSIS — E66811 Obesity, class 1: Secondary | ICD-10-CM | POA: Diagnosis not present

## 2024-01-31 DIAGNOSIS — Z79899 Other long term (current) drug therapy: Secondary | ICD-10-CM

## 2024-01-31 DIAGNOSIS — Z139 Encounter for screening, unspecified: Secondary | ICD-10-CM

## 2024-01-31 DIAGNOSIS — E6609 Other obesity due to excess calories: Secondary | ICD-10-CM

## 2024-01-31 DIAGNOSIS — Z0000 Encounter for general adult medical examination without abnormal findings: Secondary | ICD-10-CM

## 2024-01-31 DIAGNOSIS — E559 Vitamin D deficiency, unspecified: Secondary | ICD-10-CM

## 2024-01-31 DIAGNOSIS — Z6830 Body mass index (BMI) 30.0-30.9, adult: Secondary | ICD-10-CM

## 2024-01-31 DIAGNOSIS — Z13228 Encounter for screening for other metabolic disorders: Secondary | ICD-10-CM

## 2024-01-31 DIAGNOSIS — E7800 Pure hypercholesterolemia, unspecified: Secondary | ICD-10-CM

## 2024-01-31 NOTE — Progress Notes (Signed)
 LILLETTE Kristeen JINNY Gladis, CMA,acting as a Neurosurgeon for Gaines Ada, FNP.,have documented all relevant documentation on the behalf of Gaines Ada, FNP,as directed by  Gaines Ada, FNP while in the presence of Gaines Ada, FNP.  Subjective:    Patient ID: Rachel Reid , female    DOB: 01/07/1976 , 48 y.o.   MRN: [id number]  Chief Complaint  Patient presents with   Annual Exam    Patient presents today for annual exam, patient repots compliance with medications. Patient denies any chest pain, SOB, or headaches. Patient has no other concerns today. GYN: Dr Mccomb.     HPI Discussed the use of AI scribe software for clinical note transcription with the patient, who gave verbal consent to proceed.  History of Present Illness Rachel Reid is a 48 year old female who presents for an annual physical exam.  She experienced extreme fatigue since her last visit, which prompted her to seek care at [facility]. She initially suspected dehydration but also experienced shortness of breath. Blood work was performed, revealing no significant findings. 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  She has a history of vitamin D  deficiency and underwent a hysterectomy in 2015 or 2016, resulting in the cessation of her menstrual cycle. Her last A1c was 5.3. She continues regular follow-ups with her GYN and has an appointment scheduled for September. She has completed a Cologuard test for colon cancer screening.  She maintains an active lifestyle, walking about two miles two to three times a week, though she acknowledges room for dietary improvement. 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  She has large tonsils but they do not cause issues, though she occasionally experiences tonsil stones. She may snore but feels rested upon waking. No swelling in her feet or ankles.    Past Medical History:  Diagnosis Date   Anemia    hx   Atypical chest pain  01/23/2020   Endometriosis    Environmental allergies    Exertional dyspnea 01/23/2020   GERD (gastroesophageal reflux disease) 01/23/2020   Palpitations 01/23/2020   Seasonal allergies    SVD (spontaneous vaginal delivery)    x 1     Family History  Problem Relation Age of Onset   Atrial fibrillation Father      Current Outpatient Medications:    desloratadine (CLARINEX) 5 MG tablet, Take 10 mg by mouth., Disp: , Rfl:    famotidine  (PEPCID ) 20 MG tablet, Take 20 mg by mouth 2 (two) times daily., Disp: , Rfl:    folic acid (FOLVITE) 1 MG tablet, Take 1 mg by mouth daily., Disp: , Rfl:    hydroxychloroquine (PLAQUENIL) 200 MG tablet, Take 300 mg by mouth daily., Disp: , Rfl:    methotrexate (RHEUMATREX) 2.5 MG tablet, SMARTSIG:5 Tablet(s) By Mouth Once a Week, Disp: , Rfl:    Multiple Vitamin (MULTIVITAMIN WITH MINERALS) TABS tablet, Take 1 tablet by mouth daily., Disp: 90 tablet, Rfl: 1   Vitamin D , Ergocalciferol , (DRISDOL ) 1.25 MG (50000 UNIT) CAPS capsule, TAKE 1 CAPSULE (50,000 UNITS TOTAL) BY MOUTH TWO TIMES A WEEK, Disp: 24 capsule, Rfl: 1   Vitamin D , Ergocalciferol , (DRISDOL ) 1.25 MG (50000 UNIT) CAPS capsule, Take 50,000 Units by mouth., Disp: , Rfl:    Allergies  Allergen Reactions   Sulfa Drugs Cross Reactors Hives    The patient states she uses status post hysterectomy for birth control. Patient's last menstrual period was 01/05/2014. She  sees Dr. Thedora next appt end of September Negative for Dysmenorrhea and Negative for Menorrhagia. Negative for: breast discharge, breast lump(s), breast pain and breast self exam. Associated symptoms include abnormal vaginal bleeding. Pertinent negatives include abnormal bleeding (hematology), anxiety, decreased libido, depression, difficulty falling sleep, dyspareunia, history of infertility, nocturia, sexual dysfunction, sleep disturbances, urinary incontinence, urinary urgency, vaginal discharge and vaginal itching. Diet regular; admits  her diet could be better.The patient states her exercise level is moderate 2 miles a day, 2 miles 2-3 times a week.   The patient's tobacco use is:  Social History   Tobacco Use  Smoking Status Never  Smokeless Tobacco Never  She has been exposed to passive smoke. The patient's alcohol use is:  Social History   Substance and Sexual Activity  Alcohol Use No    Review of Systems  Constitutional: Negative.   HENT: Negative.    Eyes: Negative.   Respiratory: Negative.    Cardiovascular: Negative.   Gastrointestinal: Negative.   Endocrine: Negative.   Genitourinary: Negative.   Musculoskeletal: Negative.   Skin: Negative.   Neurological: Negative.   Hematological: Negative.   Psychiatric/Behavioral: Negative.       Today's Vitals   01/31/24 1520  BP: 116/80  Pulse: 74  Temp: 98.2 F (36.8 C)  TempSrc: Oral  Weight: 160 lb (72.6 kg)  Height: 5' 1 (1.549 m)  PainSc: 0-No pain   Body mass index is 30.23 kg/m.  Wt Readings from Last 3 Encounters:  01/31/24 160 lb (72.6 kg)  01/27/23 155 lb 12.8 oz (70.7 kg)  01/22/22 130 lb (59 kg)     Objective:  Physical Exam Vitals and nursing note reviewed.  Constitutional:      General: She is not in acute distress.    Appearance: Normal appearance. She is well-developed.  HENT:     Head: Normocephalic and atraumatic.     Right Ear: Hearing, tympanic membrane, ear canal and external ear normal. There is no impacted cerumen.     Left Ear: Hearing, tympanic membrane, ear canal and external ear normal. There is no impacted cerumen.     Nose: Nose normal.     Mouth/Throat:     Mouth: Mucous membranes are moist.  Eyes:     General: Lids are normal.     Extraocular Movements: Extraocular movements intact.     Conjunctiva/sclera: Conjunctivae normal.     Pupils: Pupils are equal, round, and reactive to light.     Funduscopic exam:    Right eye: No papilledema.        Left eye: No papilledema.  Neck:     Thyroid : No  thyroid  mass.     Vascular: No carotid bruit.  Cardiovascular:     Rate and Rhythm: Normal rate and regular rhythm.     Pulses: Normal pulses.     Heart sounds: Normal heart sounds. No murmur heard. Pulmonary:     Effort: Pulmonary effort is normal. No respiratory distress.     Breath sounds: Normal breath sounds. No wheezing.  Abdominal:     General: Abdomen is flat. Bowel sounds are normal. There is no distension.     Palpations: Abdomen is soft.     Tenderness: There is no abdominal tenderness.  Genitourinary:    Comments: Deferred - followed by GYN Musculoskeletal:        General: No swelling or tenderness. Normal range of motion.     Cervical back: Full passive range of motion without pain, normal range  of motion and neck supple.     Right lower leg: No edema.     Left lower leg: No edema.  Skin:    General: Skin is warm and dry.     Capillary Refill: Capillary refill takes less than 2 seconds.  Neurological:     General: No focal deficit present.     Mental Status: She is alert and oriented to person, place, and time.     Cranial Nerves: No cranial nerve deficit.     Sensory: No sensory deficit.     Motor: No weakness.  Psychiatric:        Mood and Affect: Mood normal.        Behavior: Behavior normal.        Thought Content: Thought content normal.        Judgment: Judgment normal.     Assessment And Plan:     Encounter for annual health examination Assessment & Plan: Reviewed screenings and vaccinations. Discussed potential need for iron, vitamin D , and vitamin B level checks if symptoms recur. Discussed hepatitis B immunity and vaccination options. - Check hemoglobin A1c. - Schedule flu vaccine for September. - Offer COVID-19 vaccine. - Check hepatitis B immunity and offer vaccination if not immune. - Behavior modifications discussed and diet history reviewed.   - Pt will continue to exercise regularly and modify diet with low GI, plant based foods and decrease      intake of processed foods.  - Recommend intake of daily multivitamin, Vitamin D , and calcium.  - Recommend mammogram and colonoscopy for preventive screenings, as well as recommend     immunizations that include influenza, TDAP, and Shingles    Vitamin D  deficiency Assessment & Plan: Will check vitamin D  level and supplement as needed.    Also encouraged to spend 15 minutes in the sun daily.  Managed with ergocalciferol  1.25 MG (50000 UNIT) orally twice a week.  Orders: -     VITAMIN D  25 Hydroxy (Vit-D Deficiency, Fractures)  Elevated cholesterol Assessment & Plan: LDL levels were normal at last visit. will recheck lipid panel today  Orders: -     CMP14+EGFR -     Lipid panel  Class 1 obesity due to excess calories with body mass index (BMI) of 30.0 to 30.9 in adult, unspecified whether serious comorbidity present Assessment & Plan: Discussed exercise routine and dietary habits. She walks about two miles, two to three times a week. Diet could be improved. - Encourage regular physical activity at least 150 minutes per week - Advise on dietary improvements.   Other long term (current) drug therapy -     CBC with Differential/Platelet  Encounter for screening for metabolic disorder -     Hemoglobin A1c  Encounter for screening -     Hepatitis B surface antibody,qualitative     Return for 1 year physical. Patient was given opportunity to ask questions. Patient verbalized understanding of the plan and was able to repeat key elements of the plan. All questions were answered to their satisfaction.   Gaines Ada, FNP  I, Gaines Ada, FNP, have reviewed all documentation for this visit. The documentation on 01/31/24 for the exam, diagnosis, procedures, and orders are all accurate and complete.

## 2024-01-31 NOTE — Patient Instructions (Signed)

## 2024-01-31 NOTE — Progress Notes (Deleted)
 LILLETTE Kristeen JINNY Gladis, CMA,acting as a Neurosurgeon for Gaines Ada, FNP.,have documented all relevant documentation on the behalf of Gaines Ada, FNP,as directed by  Gaines Ada, FNP while in the presence of Gaines Ada, FNP.  Subjective:  Patient ID: Rachel Reid , female    DOB: May 20, 1976 , 48 y.o.   MRN: [id number]  No chief complaint on file.   HPI  HPI   Past Medical History:  Diagnosis Date   Anemia    hx   Atypical chest pain 01/23/2020   Endometriosis    Environmental allergies    Exertional dyspnea 01/23/2020   GERD (gastroesophageal reflux disease) 01/23/2020   Palpitations 01/23/2020   Seasonal allergies    SVD (spontaneous vaginal delivery)    x 1     Family History  Problem Relation Age of Onset   Atrial fibrillation Father      Current Outpatient Medications:    desloratadine (CLARINEX) 5 MG tablet, Take 10 mg by mouth., Disp: , Rfl:    famotidine  (PEPCID ) 20 MG tablet, Take 20 mg by mouth 2 (two) times daily., Disp: , Rfl:    folic acid (FOLVITE) 1 MG tablet, Take 1 mg by mouth daily., Disp: , Rfl:    hydroxychloroquine (PLAQUENIL) 200 MG tablet, Take 300 mg by mouth daily. (Patient not taking: Reported on 10/12/2023), Disp: , Rfl:    methotrexate (RHEUMATREX) 2.5 MG tablet, SMARTSIG:5 Tablet(s) By Mouth Once a Week, Disp: , Rfl:    Multiple Vitamin (MULTIVITAMIN WITH MINERALS) TABS tablet, Take 1 tablet by mouth daily., Disp: 90 tablet, Rfl: 1   Vitamin D , Ergocalciferol , (DRISDOL ) 1.25 MG (50000 UNIT) CAPS capsule, TAKE 1 CAPSULE (50,000 UNITS TOTAL) BY MOUTH TWO TIMES A WEEK, Disp: 24 capsule, Rfl: 1   Vitamin D , Ergocalciferol , (DRISDOL ) 1.25 MG (50000 UNIT) CAPS capsule, Take 50,000 Units by mouth., Disp: , Rfl:    Allergies  Allergen Reactions   Sulfa Drugs Cross Reactors Hives     Review of Systems   There were no vitals filed for this visit. There is no height or weight on file to calculate BMI.  Wt Readings from Last 3 Encounters:  01/27/23 155 lb  12.8 oz (70.7 kg)  01/22/22 130 lb (59 kg)  07/25/21 127 lb 6.4 oz (57.8 kg)    The 10-year ASCVD risk score (Arnett DK, et al., 2019) is: 0.7%   Values used to calculate the score:     Age: 44 years     Clincally relevant sex: Female     Is Non-Hispanic African American: Yes     Diabetic: No     Tobacco smoker: No     Systolic Blood Pressure: 124 mmHg     Is BP treated: No     HDL Cholesterol: 62 mg/dL     Total Cholesterol: 171 mg/dL  Objective:  Physical Exam      Assessment And Plan:  Encounter for annual health examination  Vitamin D  deficiency  Elevated cholesterol    No follow-ups on file.  Patient was given opportunity to ask questions. Patient verbalized understanding of the plan and was able to repeat key elements of the plan. All questions were answered to their satisfaction.    LILLETTE Gaines Ada, FNP, have reviewed all documentation for this visit. The documentation on 01/31/24 for the exam, diagnosis, procedures, and orders are all accurate and complete.   IF YOU HAVE BEEN REFERRED TO A SPECIALIST, IT MAY TAKE 1-2 WEEKS TO SCHEDULE/PROCESS THE REFERRAL. IF  YOU HAVE NOT HEARD FROM US /SPECIALIST IN TWO WEEKS, PLEASE GIVE US  A CALL AT [phone].

## 2024-02-01 LAB — CBC WITH DIFFERENTIAL/PLATELET
Basophils Absolute: 0 x10E3/uL (ref 0.0–0.2)
Basos: 0 %
EOS (ABSOLUTE): 0.1 x10E3/uL (ref 0.0–0.4)
Eos: 3 %
Hematocrit: 38.9 % (ref 34.0–46.6)
Hemoglobin: 12.3 g/dL (ref 11.1–15.9)
Immature Grans (Abs): 0 x10E3/uL (ref 0.0–0.1)
Immature Granulocytes: 0 %
Lymphocytes Absolute: 1.3 x10E3/uL (ref 0.7–3.1)
Lymphs: 29 %
MCH: 28.9 pg (ref 26.6–33.0)
MCHC: 31.6 g/dL (ref 31.5–35.7)
MCV: 92 fL (ref 79–97)
Monocytes Absolute: 0.4 x10E3/uL (ref 0.1–0.9)
Monocytes: 9 %
Neutrophils Absolute: 2.7 x10E3/uL (ref 1.4–7.0)
Neutrophils: 59 %
Platelets: 117 x10E3/uL — ABNORMAL LOW (ref 150–450)
RBC: 4.25 x10E6/uL (ref 3.77–5.28)
RDW: 13.1 % (ref 11.7–15.4)
WBC: 4.5 x10E3/uL (ref 3.4–10.8)

## 2024-02-01 LAB — LIPID PANEL
Chol/HDL Ratio: 2.9 ratio (ref 0.0–4.4)
Cholesterol, Total: 170 mg/dL (ref 100–199)
HDL: 58 mg/dL (ref >39)
LDL Chol Calc (NIH): 99 mg/dL (ref 0–99)
Triglycerides: 66 mg/dL (ref 0–149)
VLDL Cholesterol Cal: 13 mg/dL (ref 5–40)

## 2024-02-01 LAB — CMP14+EGFR
ALT: 11 IU/L (ref 0–32)
AST: 26 IU/L (ref 0–40)
Albumin: 4.5 g/dL (ref 3.9–4.9)
Alkaline Phosphatase: 84 IU/L (ref 44–121)
BUN/Creatinine Ratio: 14 (ref 9–23)
BUN: 9 mg/dL (ref 6–24)
Bilirubin Total: 0.7 mg/dL (ref 0.0–1.2)
CO2: 21 mmol/L (ref 20–29)
Calcium: 9.2 mg/dL (ref 8.7–10.2)
Chloride: 103 mmol/L (ref 96–106)
Creatinine, Ser: 0.66 mg/dL (ref 0.57–1.00)
Globulin, Total: 2.7 g/dL (ref 1.5–4.5)
Glucose: 74 mg/dL (ref 70–99)
Potassium: 3.6 mmol/L (ref 3.5–5.2)
Sodium: 139 mmol/L (ref 134–144)
Total Protein: 7.2 g/dL (ref 6.0–8.5)
eGFR: 109 mL/min/1.73 (ref >59)

## 2024-02-01 LAB — HEMOGLOBIN A1C
Est. average glucose Bld gHb Est-mCnc: 103 mg/dL
Hgb A1c MFr Bld: 5.2 % (ref 4.8–5.6)

## 2024-02-01 LAB — VITAMIN D 25 HYDROXY (VIT D DEFICIENCY, FRACTURES): Vit D, 25-Hydroxy: 23.3 ng/mL — ABNORMAL LOW (ref 30.0–100.0)

## 2024-02-01 LAB — HEPATITIS B SURFACE ANTIBODY,QUALITATIVE: Hep B Surface Ab, Qual: NONREACTIVE

## 2024-02-11 ENCOUNTER — Ambulatory Visit: Payer: Self-pay | Admitting: Nurse Practitioner

## 2024-02-11 DIAGNOSIS — E559 Vitamin D deficiency, unspecified: Secondary | ICD-10-CM

## 2024-02-11 DIAGNOSIS — E66811 Obesity, class 1: Secondary | ICD-10-CM | POA: Insufficient documentation

## 2024-02-11 MED ORDER — VITAMIN D (ERGOCALCIFEROL) 1.25 MG (50000 UNIT) PO CAPS: 50000.0000 [IU] | ORAL_CAPSULE | ORAL | 1 refills | Status: AC

## 2024-02-11 NOTE — Assessment & Plan Note (Signed)
 Will check vitamin D  level and supplement as needed.    Also encouraged to spend 15 minutes in the sun daily.  Managed with ergocalciferol  1.25 MG (50000 UNIT) orally twice a week.

## 2024-02-11 NOTE — Assessment & Plan Note (Signed)
 Discussed exercise routine and dietary habits. She walks about two miles, two to three times a week. Diet could be improved. - Encourage regular physical activity at least 150 minutes per week - Advise on dietary improvements.

## 2024-02-11 NOTE — Assessment & Plan Note (Signed)
 Reviewed screenings and vaccinations. Discussed potential need for iron, vitamin D , and vitamin B level checks if symptoms recur. Discussed hepatitis B immunity and vaccination options. - Check hemoglobin A1c. - Schedule flu vaccine for September. - Offer COVID-19 vaccine. - Check hepatitis B immunity and offer vaccination if not immune. - Behavior modifications discussed and diet history reviewed.   - Pt will continue to exercise regularly and modify diet with low GI, plant based foods and decrease     intake of processed foods.  - Recommend intake of daily multivitamin, Vitamin D , and calcium.  - Recommend mammogram and colonoscopy for preventive screenings, as well as recommend     immunizations that include influenza, TDAP, and Shingles

## 2024-02-11 NOTE — Assessment & Plan Note (Signed)
 LDL levels were normal at last visit. will recheck lipid panel today

## 2025-01-31 ENCOUNTER — Encounter: Payer: Self-pay | Admitting: Nurse Practitioner
